# Patient Record
Sex: Female | Born: 1960 | Race: Black or African American | Hispanic: No | Marital: Married | Smoking: Current every day smoker
Health system: Southern US, Community
[De-identification: ages and names within clinical notes are randomized; demographics above are authoritative.]

## PROBLEM LIST (undated history)

## (undated) DIAGNOSIS — H0520 Unspecified exophthalmos: Secondary | ICD-10-CM

## (undated) DIAGNOSIS — J45909 Unspecified asthma, uncomplicated: Secondary | ICD-10-CM

## (undated) DIAGNOSIS — F32A Depression, unspecified: Secondary | ICD-10-CM

## (undated) DIAGNOSIS — F329 Major depressive disorder, single episode, unspecified: Secondary | ICD-10-CM

## (undated) DIAGNOSIS — R7989 Other specified abnormal findings of blood chemistry: Secondary | ICD-10-CM

## (undated) DIAGNOSIS — D473 Essential (hemorrhagic) thrombocythemia: Secondary | ICD-10-CM

## (undated) DIAGNOSIS — F419 Anxiety disorder, unspecified: Secondary | ICD-10-CM

## (undated) DIAGNOSIS — Z5189 Encounter for other specified aftercare: Secondary | ICD-10-CM

## (undated) DIAGNOSIS — E119 Type 2 diabetes mellitus without complications: Secondary | ICD-10-CM

## (undated) DIAGNOSIS — M1990 Unspecified osteoarthritis, unspecified site: Secondary | ICD-10-CM

## (undated) DIAGNOSIS — K219 Gastro-esophageal reflux disease without esophagitis: Secondary | ICD-10-CM

## (undated) DIAGNOSIS — E785 Hyperlipidemia, unspecified: Secondary | ICD-10-CM

## (undated) HISTORY — DX: Encounter for other specified aftercare: Z51.89

## (undated) HISTORY — DX: Hyperlipidemia, unspecified: E78.5

## (undated) HISTORY — DX: Gastro-esophageal reflux disease without esophagitis: K21.9

## (undated) HISTORY — DX: Unspecified exophthalmos: H05.20

## (undated) HISTORY — DX: Depression, unspecified: F32.A

## (undated) HISTORY — DX: Anxiety disorder, unspecified: F41.9

## (undated) HISTORY — DX: Type 2 diabetes mellitus without complications: E11.9

## (undated) HISTORY — DX: Other specified abnormal findings of blood chemistry: R79.89

## (undated) HISTORY — PX: BREAST BIOPSY: SHX20

## (undated) HISTORY — DX: Major depressive disorder, single episode, unspecified: F32.9

## (undated) HISTORY — DX: Unspecified osteoarthritis, unspecified site: M19.90

---
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
## 1995-09-10 HISTORY — PX: CHOLECYSTECTOMY: SHX55

## 2012-12-15 DIAGNOSIS — R109 Unspecified abdominal pain: Secondary | ICD-10-CM | POA: Insufficient documentation

## 2013-09-05 ENCOUNTER — Emergency Department: Payer: Self-pay | Admitting: Emergency Medicine

## 2013-09-05 LAB — WET PREP, GENITAL

## 2013-09-05 LAB — URINALYSIS, COMPLETE
Bilirubin,UR: NEGATIVE
Glucose,UR: >=500 mg/dL (ref 0–75)
Ketone: NEGATIVE
Leukocyte Esterase: NEGATIVE
Nitrite: NEGATIVE
Ph: 6 (ref 4.5–8.0)
WBC UR: <1 /HPF (ref 0–5)

## 2013-09-05 LAB — COMPREHENSIVE METABOLIC PANEL
Alkaline Phosphatase: 163 U/L — ABNORMAL HIGH
BUN: 6 mg/dL — ABNORMAL LOW (ref 7–18)
Bilirubin,Total: 0.3 mg/dL (ref 0.2–1.0)
Calcium, Total: 9.2 mg/dL (ref 8.5–10.1)
Chloride: 98 mmol/L (ref 98–107)
EGFR (African American): >60
EGFR (Non-African Amer.): >60
Glucose: 656 mg/dL (ref 65–99)
Osmolality: 293 (ref 275–301)
Potassium: 3 mmol/L — ABNORMAL LOW (ref 3.5–5.1)
SGOT(AST): 21 U/L (ref 15–37)
SGPT (ALT): 21 U/L (ref 12–78)
Sodium: 132 mmol/L — ABNORMAL LOW (ref 136–145)
Total Protein: 7.5 g/dL (ref 6.4–8.2)

## 2013-09-05 LAB — CBC
HGB: 13.1 g/dL (ref 12.0–16.0)
MCH: 27.3 pg (ref 26.0–34.0)
MCV: 80 fL (ref 80–100)
Platelet: 391 10*3/uL (ref 150–440)
WBC: 7.9 10*3/uL (ref 3.6–11.0)

## 2013-09-05 LAB — LIPASE, BLOOD: Lipase: 93 U/L (ref 73–393)

## 2013-09-18 ENCOUNTER — Emergency Department: Payer: Self-pay | Admitting: Emergency Medicine

## 2013-09-18 LAB — COMPREHENSIVE METABOLIC PANEL
ALT: 29 U/L (ref 12–78)
ANION GAP: 6 — AB (ref 7–16)
Albumin: 3.8 g/dL (ref 3.4–5.0)
Alkaline Phosphatase: 109 U/L
BUN: 4 mg/dL — ABNORMAL LOW (ref 7–18)
Bilirubin,Total: 0.3 mg/dL (ref 0.2–1.0)
CO2: 29 mmol/L (ref 21–32)
CREATININE: 0.79 mg/dL (ref 0.60–1.30)
Calcium, Total: 9.7 mg/dL (ref 8.5–10.1)
Chloride: 96 mmol/L — ABNORMAL LOW (ref 98–107)
EGFR (African American): >60
EGFR (Non-African Amer.): >60
GLUCOSE: 356 mg/dL — AB (ref 65–99)
Osmolality: 274 (ref 275–301)
Potassium: 2.9 mmol/L — ABNORMAL LOW (ref 3.5–5.1)
SGOT(AST): 13 U/L — ABNORMAL LOW (ref 15–37)
Sodium: 131 mmol/L — ABNORMAL LOW (ref 136–145)
Total Protein: 7.6 g/dL (ref 6.4–8.2)

## 2013-09-18 LAB — TROPONIN I: Troponin-I: <0.02 ng/mL

## 2013-09-18 LAB — CBC
HCT: 38 % (ref 35.0–47.0)
HGB: 13.5 g/dL (ref 12.0–16.0)
MCH: 28.3 pg (ref 26.0–34.0)
MCHC: 35.4 g/dL (ref 32.0–36.0)
MCV: 80 fL (ref 80–100)
Platelet: 500 10*3/uL — ABNORMAL HIGH (ref 150–440)
RBC: 4.75 10*6/uL (ref 3.80–5.20)
RDW: 14.4 % (ref 11.5–14.5)
WBC: 10.8 10*3/uL (ref 3.6–11.0)

## 2013-09-18 LAB — URINALYSIS, COMPLETE
BACTERIA: NONE SEEN
Bilirubin,UR: NEGATIVE
Blood: NEGATIVE
Ketone: NEGATIVE
LEUKOCYTE ESTERASE: NEGATIVE
Nitrite: NEGATIVE
Ph: 6 (ref 4.5–8.0)
Protein: NEGATIVE
RBC,UR: 1 /HPF (ref 0–5)
SPECIFIC GRAVITY: 1.02 (ref 1.003–1.030)
Squamous Epithelial: 1

## 2013-09-24 ENCOUNTER — Emergency Department: Payer: Self-pay | Admitting: Emergency Medicine

## 2013-09-24 LAB — URINALYSIS, COMPLETE
Bilirubin,UR: NEGATIVE
Ketone: NEGATIVE
Nitrite: NEGATIVE
PH: 5 (ref 4.5–8.0)
Protein: NEGATIVE
RBC,UR: 1 /HPF (ref 0–5)
SPECIFIC GRAVITY: 1.012 (ref 1.003–1.030)
Squamous Epithelial: <1
WBC UR: 3 /HPF (ref 0–5)

## 2014-03-15 DIAGNOSIS — E669 Obesity, unspecified: Secondary | ICD-10-CM | POA: Insufficient documentation

## 2014-03-15 DIAGNOSIS — E119 Type 2 diabetes mellitus without complications: Secondary | ICD-10-CM | POA: Insufficient documentation

## 2014-03-15 DIAGNOSIS — F17200 Nicotine dependence, unspecified, uncomplicated: Secondary | ICD-10-CM | POA: Insufficient documentation

## 2014-05-07 ENCOUNTER — Emergency Department: Payer: Self-pay | Admitting: Emergency Medicine

## 2014-05-07 LAB — BASIC METABOLIC PANEL
Anion Gap: 8 (ref 7–16)
BUN: 16 mg/dL (ref 7–18)
CHLORIDE: 103 mmol/L (ref 98–107)
Calcium, Total: 8.6 mg/dL (ref 8.5–10.1)
Co2: 29 mmol/L (ref 21–32)
Creatinine: 0.96 mg/dL (ref 0.60–1.30)
EGFR (African American): >60
EGFR (Non-African Amer.): >60
GLUCOSE: 192 mg/dL — AB (ref 65–99)
Osmolality: 286 (ref 275–301)
Potassium: 3.1 mmol/L — ABNORMAL LOW (ref 3.5–5.1)
Sodium: 140 mmol/L (ref 136–145)

## 2014-05-07 LAB — HEPATIC FUNCTION PANEL A (ARMC)
ALBUMIN: 3.3 g/dL — AB (ref 3.4–5.0)
ALK PHOS: 75 U/L
ALT: 19 U/L
BILIRUBIN TOTAL: 0.3 mg/dL (ref 0.2–1.0)
Bilirubin, Direct: <0.1 mg/dL (ref 0.00–0.20)
SGOT(AST): 21 U/L (ref 15–37)
TOTAL PROTEIN: 7.1 g/dL (ref 6.4–8.2)

## 2014-05-07 LAB — CBC
HCT: 37.1 % (ref 35.0–47.0)
HGB: 12.4 g/dL (ref 12.0–16.0)
MCH: 27.9 pg (ref 26.0–34.0)
MCHC: 33.5 g/dL (ref 32.0–36.0)
MCV: 83 fL (ref 80–100)
Platelet: 448 10*3/uL — ABNORMAL HIGH (ref 150–440)
RBC: 4.45 10*6/uL (ref 3.80–5.20)
RDW: 13.3 % (ref 11.5–14.5)
WBC: 10.4 10*3/uL (ref 3.6–11.0)

## 2014-05-07 LAB — TROPONIN I: Troponin-I: <0.02 ng/mL

## 2014-05-07 LAB — LIPASE, BLOOD: LIPASE: 56 U/L — AB (ref 73–393)

## 2014-08-17 ENCOUNTER — Emergency Department: Payer: Self-pay | Admitting: Emergency Medicine

## 2015-02-20 ENCOUNTER — Encounter: Payer: Self-pay | Admitting: Family Medicine

## 2015-02-20 ENCOUNTER — Ambulatory Visit (INDEPENDENT_AMBULATORY_CARE_PROVIDER_SITE_OTHER): Payer: Medicaid Other | Admitting: Family Medicine

## 2015-02-20 ENCOUNTER — Encounter (INDEPENDENT_AMBULATORY_CARE_PROVIDER_SITE_OTHER): Payer: Self-pay

## 2015-02-20 VITALS — BP 128/70 | HR 95 | Temp 98.5°F | Resp 16 | Ht 67.0 in | Wt 226.9 lb

## 2015-02-20 DIAGNOSIS — F418 Other specified anxiety disorders: Secondary | ICD-10-CM | POA: Diagnosis not present

## 2015-02-20 DIAGNOSIS — G4700 Insomnia, unspecified: Secondary | ICD-10-CM | POA: Insufficient documentation

## 2015-02-20 DIAGNOSIS — Z1211 Encounter for screening for malignant neoplasm of colon: Secondary | ICD-10-CM | POA: Diagnosis not present

## 2015-02-20 DIAGNOSIS — E119 Type 2 diabetes mellitus without complications: Secondary | ICD-10-CM | POA: Insufficient documentation

## 2015-02-20 DIAGNOSIS — E668 Other obesity: Secondary | ICD-10-CM | POA: Insufficient documentation

## 2015-02-20 DIAGNOSIS — M2550 Pain in unspecified joint: Secondary | ICD-10-CM

## 2015-02-20 DIAGNOSIS — E559 Vitamin D deficiency, unspecified: Secondary | ICD-10-CM | POA: Diagnosis not present

## 2015-02-20 DIAGNOSIS — K219 Gastro-esophageal reflux disease without esophagitis: Secondary | ICD-10-CM | POA: Diagnosis not present

## 2015-02-20 DIAGNOSIS — E669 Obesity, unspecified: Secondary | ICD-10-CM

## 2015-02-20 DIAGNOSIS — F419 Anxiety disorder, unspecified: Secondary | ICD-10-CM

## 2015-02-20 DIAGNOSIS — Z23 Encounter for immunization: Secondary | ICD-10-CM

## 2015-02-20 DIAGNOSIS — F329 Major depressive disorder, single episode, unspecified: Secondary | ICD-10-CM | POA: Insufficient documentation

## 2015-02-20 DIAGNOSIS — E782 Mixed hyperlipidemia: Secondary | ICD-10-CM | POA: Insufficient documentation

## 2015-02-20 DIAGNOSIS — E1169 Type 2 diabetes mellitus with other specified complication: Secondary | ICD-10-CM | POA: Insufficient documentation

## 2015-02-20 DIAGNOSIS — E785 Hyperlipidemia, unspecified: Secondary | ICD-10-CM

## 2015-02-20 DIAGNOSIS — R110 Nausea: Secondary | ICD-10-CM | POA: Diagnosis not present

## 2015-02-20 MED ORDER — CITALOPRAM HYDROBROMIDE 20 MG PO TABS: 20.0000 mg | ORAL_TABLET | Freq: Every day | ORAL | Status: DC

## 2015-02-20 MED ORDER — NAPROXEN 500 MG PO TABS: 500.0000 mg | ORAL_TABLET | Freq: Two times a day (BID) | ORAL | Status: DC

## 2015-02-20 MED ORDER — TRAZODONE HCL 50 MG PO TABS: 50.0000 mg | ORAL_TABLET | Freq: Every evening | ORAL | Status: DC | PRN

## 2015-02-20 MED ORDER — PRAVASTATIN SODIUM 10 MG PO TABS: 10.0000 mg | ORAL_TABLET | Freq: Every day | ORAL | Status: DC

## 2015-02-20 MED ORDER — ACCU-CHEK FASTCLIX LANCETS MISC: 1.0000 | Freq: Three times a day (TID) | Status: DC

## 2015-02-20 MED ORDER — VITAMIN D (ERGOCALCIFEROL) 1.25 MG (50000 UNIT) PO CAPS: 50000.0000 [IU] | ORAL_CAPSULE | ORAL | Status: DC

## 2015-02-20 MED ORDER — CYCLOBENZAPRINE HCL 5 MG PO TABS: 5.0000 mg | ORAL_TABLET | Freq: Every day | ORAL | Status: DC

## 2015-02-20 MED ORDER — ONDANSETRON HCL 4 MG PO TABS: 4.0000 mg | ORAL_TABLET | Freq: Three times a day (TID) | ORAL | Status: DC | PRN

## 2015-02-20 MED ORDER — OMEPRAZOLE 20 MG PO TBEC: 1.0000 | DELAYED_RELEASE_TABLET | Freq: Every day | ORAL | Status: DC

## 2015-02-20 MED ORDER — INSULIN GLARGINE 100 UNIT/ML SOLOSTAR PEN: 30.0000 [IU] | PEN_INJECTOR | Freq: Every day | SUBCUTANEOUS | Status: DC

## 2015-02-20 MED ORDER — SITAGLIP PHOS-METFORMIN HCL ER 50-1000 MG PO TB24: 1.0000 | ORAL_TABLET | Freq: Every day | ORAL | Status: DC

## 2015-02-20 NOTE — Progress Notes (Signed)
Name: Faith Smith   MRN: [id number]    DOB: [birth year]   Date:02/20/2015       Progress Note  Subjective  Chief Complaint  Chief Complaint  Patient presents with  . Diabetes    patient is here for her 2 month follow-up & refill of her medications  . Hyperlipidemia  . Obesity  . Arthritis    HPI  Patient is here for routine follow up of Diabetes Type II.  Current diabetes medication regimen includes Lantus 30units at bedtime, Janumet XR 50-1000mg  one a day. Patient is taking medications as instructed. Overall the patient feels that their blood glucose is well controlled. Checking blood glucose 1-3 times a day consistently. Fasting glucose range 100-140 mg/dL. Meal time glucose range 160-225 mg/dL. Lowest glucose result 103 mg/dL.   Related symptoms? None Any hypoglycemic symptoms?  No  Last Diabetic Eye Exam? 03/18/2014 Checking feet daily? Yes  Hyperlipidemia: Patient presents with hyperlipidemia.  She was tested because she also has DMII.  Her last labs showed Total cholesterol of 228mg /dL, HDL 37mg /dL, LDL 143mg /dL,  Triglycerides 240mg /dL. negative. There is a family history of hyperlipidemia. There is not a family history of early ischemia heart disease.   In general needs refills of all medications. Anxiety well controled on Celexa 20mg . Insomnia reasonably controled on Trazodone 50mg .   Has never had C-scope but is willing to go now. Due to TDAP shot. Has never had Pneumococcal shot.      Past Medical History  Diagnosis Date  . Anxiety   . Arthritis   . Blood transfusion without reported diagnosis     previously  . Depression   . Diabetes mellitus without complication   . GERD (gastroesophageal reflux disease)   . Hyperlipidemia     Past Surgical History  Procedure Laterality Date  . Cholecystectomy    . Cesarean section      had 3    Family History  Problem Relation Age of Onset  . Cancer Mother   . Hypertension Mother   . Depression Brother      History   Social History  . Marital Status: Married    Spouse Name: N/A  . Number of Children: 7  . Years of Education: N/A   Occupational History  . Not on file.   Social History Main Topics  . Smoking status: Current Every Day Smoker    Types: Cigarettes  . Smokeless tobacco: Not on file     Comment: patient states she is working on it now  . Alcohol Use: No  . Drug Use: No  . Sexual Activity:    Partners: Male   Other Topics Concern  . Not on file   Social History Narrative  . No narrative on file     Current outpatient prescriptions:  .  citalopram (CELEXA) 20 MG tablet, Take by mouth., Disp: , Rfl:  .  cyclobenzaprine (FLEXERIL) 5 MG tablet, Take by mouth., Disp: , Rfl:  .  Insulin Glargine (LANTUS SOLOSTAR) 100 UNIT/ML Solostar Pen, Inject into the skin., Disp: , Rfl:  .  naproxen (NAPROSYN) 500 MG tablet, Take by mouth., Disp: , Rfl:  .  Omeprazole 20 MG TBEC, Take by mouth., Disp: , Rfl:  .  ondansetron (ZOFRAN) 4 MG tablet, Take by mouth., Disp: , Rfl:  .  pravastatin (PRAVACHOL) 10 MG tablet, Take by mouth., Disp: , Rfl:  .  SitaGLIPtin-MetFORMIN HCl (JANUMET XR) 50-1000 MG TB24, Take by mouth., Disp: , Rfl:  .  traZODone (DESYREL) 50 MG tablet, Take by mouth., Disp: , Rfl:  .  Vitamin D, Ergocalciferol, (DRISDOL) 50000 UNITS CAPS capsule, Take by mouth., Disp: , Rfl:   Allergies  Allergen Reactions  . Atorvastatin Swelling     ROS  CONSTITUTIONAL: No significant weight changes, fever, chills, weakness or fatigue.  HEENT:  - Eyes: No visual changes.  - Ears: No auditory changes. No pain.  - Nose: No sneezing, congestion, runny nose. - Throat: No sore throat. No changes in swallowing. SKIN: No rash or itching.  CARDIOVASCULAR: No chest pain, chest pressure or chest discomfort. No palpitations or edema.  RESPIRATORY: No shortness of breath, cough or sputum.  GASTROINTESTINAL: No anorexia, nausea, vomiting. No changes in bowel habits. No  abdominal pain or blood.  GENITOURINARY: No dysuria. No frequency. No discharge.  NEUROLOGICAL: No headache, dizziness, syncope, paralysis, ataxia, numbness or tingling in the extremities. No memory changes. No change in bowel or bladder control.  MUSCULOSKELETAL: No joint pain. No muscle pain. HEMATOLOGIC: No anemia, bleeding or bruising.  LYMPHATICS: No enlarged lymph nodes.  PSYCHIATRIC: No change in mood. No change in sleep pattern.  ENDOCRINOLOGIC: No reports of sweating, cold or heat intolerance. No polyuria or polydipsia.   Objective  Filed Vitals:   02/20/15 0904  BP: 128/70  Pulse: 95  Temp: 98.5 F (36.9 C)  TempSrc: Oral  Resp: 16  Height: 5\' 7"  (1.702 m)  Weight: 226 lb 14.4 oz (102.921 kg)  SpO2: 97%    Physical Exam  Constitutional: Patient appears well-developed and well-nourished. In no distress.  HEENT:  - Head: Normocephalic and atraumatic.  - Ears: Bilateral TMs gray, no erythema or effusion - Nose: Nasal mucosa moist - Mouth/Throat: Oropharynx is clear and moist. No tonsillar hypertrophy or erythema. No post nasal drainage.  - Eyes: Conjunctivae clear, EOM movements normal. PERRLA. No scleral icterus.  Neck: Normal range of motion. Neck supple. No JVD present. No thyromegaly present.  Cardiovascular: Normal rate, regular rhythm and normal heart sounds.  No murmur heard.  Pulmonary/Chest: Effort normal and breath sounds normal. No respiratory distress. Musculoskeletal: Normal range of motion bilateral UE and LE, no joint effusions. Peripheral vascular: Bilateral LE no edema. Neurological: CN II-XII grossly intact with no focal deficits. Alert and oriented to person, place, and time. Coordination, balance, strength, speech and gait are normal.  Skin: Skin is warm and dry. No rash noted. No erythema.  Psychiatric: Patient has a normal mood and affect. Behavior is normal in office today. Judgment and thought content normal in office today.   Assessment &  Plan  1. Diabetes mellitus type 2, controlled, without complications  - Insulin Glargine (LANTUS SOLOSTAR) 100 UNIT/ML Solostar Pen; Inject 30 Units into the skin at bedtime.  Dispense: 3 pen; Refill: 5 - CBC with Differential/Platelet - COMPLETE METABOLIC PANEL WITH GFR - TSH - SitaGLIPtin-MetFORMIN HCl (JANUMET XR) 50-1000 MG TB24; Take 1 tablet by mouth daily.  Dispense: 90 tablet; Refill: 2 - ACCU-CHEK FASTCLIX LANCETS MISC; 1 Device by Does not apply route 3 (three) times daily.  Dispense: 100 each; Refill: 5  2. HLD (hyperlipidemia)  - Lipid panel - pravastatin (PRAVACHOL) 10 MG tablet; Take 1 tablet (10 mg total) by mouth at bedtime.  Dispense: 90 tablet; Refill: 2  3. Anxiety and depression  - citalopram (CELEXA) 20 MG tablet; Take 1 tablet (20 mg total) by mouth daily.  Dispense: 90 tablet; Refill: 2  4. Avitaminosis D  - Vitamin D 1,25 dihydroxy - Vitamin D,  Ergocalciferol, (DRISDOL) 50000 UNITS CAPS capsule; Take 1 capsule (50,000 Units total) by mouth every 7 (seven) days.  Dispense: 12 capsule; Refill: 0  5. Arthralgia of multiple joints  - cyclobenzaprine (FLEXERIL) 5 MG tablet; Take 1 tablet (5 mg total) by mouth at bedtime.  Dispense: 90 tablet; Refill: 1 - naproxen (NAPROSYN) 500 MG tablet; Take 1 tablet (500 mg total) by mouth 2 (two) times daily with a meal.  Dispense: 60 tablet; Refill: 5  6. Nausea without vomiting  - ondansetron (ZOFRAN) 4 MG tablet; Take 1 tablet (4 mg total) by mouth every 8 (eight) hours as needed for nausea or vomiting.  Dispense: 30 tablet; Refill: 2  7. Insomnia  - traZODone (DESYREL) 50 MG tablet; Take 1 tablet (50 mg total) by mouth at bedtime as needed for sleep.  Dispense: 30 tablet; Refill: 5  8. Colon cancer screening  - Ambulatory referral to General Surgery  9. Need for diphtheria-tetanus-pertussis (Tdap) vaccine, adult/adolescent  - Tdap vaccine greater than or equal to 7yo IM  10. Gastroesophageal reflux disease  without esophagitis  - Omeprazole 20 MG TBEC; Take 1 tablet (20 mg total) by mouth daily.  Dispense: 90 each; Refill: 2

## 2015-02-20 NOTE — Addendum Note (Signed)
Addended by: Bobetta Lime on: 02/20/2015 04:15 PM   Modules accepted: Miquel Dunn

## 2015-02-21 ENCOUNTER — Other Ambulatory Visit: Payer: Self-pay | Admitting: Family Medicine

## 2015-02-21 DIAGNOSIS — E782 Mixed hyperlipidemia: Secondary | ICD-10-CM

## 2015-02-21 DIAGNOSIS — E785 Hyperlipidemia, unspecified: Secondary | ICD-10-CM

## 2015-02-21 LAB — CBC WITH DIFFERENTIAL/PLATELET
BASOS ABS: 0.1 10*3/uL (ref 0.0–0.2)
Basos: 1 %
EOS (ABSOLUTE): 0.3 10*3/uL (ref 0.0–0.4)
Eos: 3 %
HEMATOCRIT: 36.8 % (ref 34.0–46.6)
Hemoglobin: 12.4 g/dL (ref 11.1–15.9)
Immature Grans (Abs): 0.1 10*3/uL (ref 0.0–0.1)
Immature Granulocytes: 1 %
Lymphocytes Absolute: 2.8 10*3/uL (ref 0.7–3.1)
Lymphs: 30 %
MCH: 27.7 pg (ref 26.6–33.0)
MCHC: 33.7 g/dL (ref 31.5–35.7)
MCV: 82 fL (ref 79–97)
Monocytes Absolute: 0.4 10*3/uL (ref 0.1–0.9)
Monocytes: 4 %
NEUTROS ABS: 5.7 10*3/uL (ref 1.4–7.0)
Neutrophils: 61 %
Platelets: 404 10*3/uL — ABNORMAL HIGH (ref 150–379)
RBC: 4.48 x10E6/uL (ref 3.77–5.28)
RDW: 13.5 % (ref 12.3–15.4)
WBC: 9.4 10*3/uL (ref 3.4–10.8)

## 2015-02-21 LAB — CMP14+EGFR
ALK PHOS: 75 IU/L (ref 39–117)
ALT: 20 IU/L (ref 0–32)
AST: 16 IU/L (ref 0–40)
Albumin/Globulin Ratio: 1.6 (ref 1.1–2.5)
Albumin: 4.2 g/dL (ref 3.5–5.5)
BUN / CREAT RATIO: 12 (ref 9–23)
BUN: 8 mg/dL (ref 6–24)
CHLORIDE: 99 mmol/L (ref 97–108)
CO2: 22 mmol/L (ref 18–29)
Calcium: 9.9 mg/dL (ref 8.7–10.2)
Creatinine, Ser: 0.69 mg/dL (ref 0.57–1.00)
GFR calc non Af Amer: 98 mL/min/{1.73_m2} (ref >59)
GFR, EST AFRICAN AMERICAN: 113 mL/min/{1.73_m2} (ref >59)
Globulin, Total: 2.6 g/dL (ref 1.5–4.5)
Glucose: 162 mg/dL — ABNORMAL HIGH (ref 65–99)
Potassium: 3.9 mmol/L (ref 3.5–5.2)
Sodium: 140 mmol/L (ref 134–144)
Total Protein: 6.8 g/dL (ref 6.0–8.5)

## 2015-02-21 LAB — TSH: TSH: 1.1 u[IU]/mL (ref 0.450–4.500)

## 2015-02-21 LAB — LIPID PANEL
Chol/HDL Ratio: 6.8 ratio units — ABNORMAL HIGH (ref 0.0–4.4)
Cholesterol, Total: 217 mg/dL — ABNORMAL HIGH (ref 100–199)
HDL: 32 mg/dL — AB (ref >39)
LDL CALC: 120 mg/dL — AB (ref 0–99)
Triglycerides: 325 mg/dL — ABNORMAL HIGH (ref 0–149)
VLDL CHOLESTEROL CAL: 65 mg/dL — AB (ref 5–40)

## 2015-02-21 MED ORDER — PRAVASTATIN SODIUM 20 MG PO TABS: 20.0000 mg | ORAL_TABLET | Freq: Every day | ORAL | Status: DC

## 2015-02-23 ENCOUNTER — Emergency Department
Admission: EM | Admit: 2015-02-23 | Discharge: 2015-02-23 | Disposition: A | Discharge status: Home / Self Care | Payer: Medicaid Other | Attending: Emergency Medicine | Admitting: Emergency Medicine

## 2015-02-23 ENCOUNTER — Encounter: Payer: Self-pay | Admitting: Emergency Medicine

## 2015-02-23 DIAGNOSIS — H578 Other specified disorders of eye and adnexa: Secondary | ICD-10-CM | POA: Diagnosis present

## 2015-02-23 DIAGNOSIS — E119 Type 2 diabetes mellitus without complications: Secondary | ICD-10-CM | POA: Insufficient documentation

## 2015-02-23 DIAGNOSIS — Z794 Long term (current) use of insulin: Secondary | ICD-10-CM | POA: Diagnosis not present

## 2015-02-23 DIAGNOSIS — Z720 Tobacco use: Secondary | ICD-10-CM | POA: Diagnosis not present

## 2015-02-23 DIAGNOSIS — Z79899 Other long term (current) drug therapy: Secondary | ICD-10-CM | POA: Insufficient documentation

## 2015-02-23 DIAGNOSIS — H109 Unspecified conjunctivitis: Secondary | ICD-10-CM | POA: Insufficient documentation

## 2015-02-23 MED ORDER — TOBRAMYCIN 0.3 % OP SOLN: 2.0000 [drp] | Freq: Four times a day (QID) | OPHTHALMIC | Status: AC

## 2015-02-23 NOTE — Discharge Instructions (Signed)

## 2015-02-23 NOTE — ED Provider Notes (Signed)
CSN: [id number]     Arrival date & time 02/23/15  1055 History   First MD Initiated Contact with Patient 02/23/15 1129     Chief Complaint  Patient presents with  . Conjunctivitis     HPI Comments: 54 year old female presents today complaining of left and right eye irritation for the past 3 days. Patient reports initially her left eye was irritated and now it has moved into her right eye. Her daughter works at an assisted living facility and has had conjunctivitis recently. She does not complain of any blurred vision, eye pain, sinus congestion or fevers. Not a contact lens wearer  Patient is a 54 y.o. female presenting with conjunctivitis. The history is provided by the patient.  Conjunctivitis This is a new problem. The current episode started in the past 7 days. The problem occurs constantly. The problem has been gradually worsening. Pertinent negatives include no chills, congestion, fever, nausea, rash, sore throat, visual change or vomiting. Nothing aggravates the symptoms. She has tried nothing for the symptoms. The treatment provided no relief.    Past Medical History  Diagnosis Date  . Anxiety   . Arthritis   . Blood transfusion without reported diagnosis     previously  . Depression   . Diabetes mellitus without complication   . GERD (gastroesophageal reflux disease)   . Hyperlipidemia    Past Surgical History  Procedure Laterality Date  . Cholecystectomy    . Cesarean section      had 3   Family History  Problem Relation Age of Onset  . Cancer Mother   . Hypertension Mother   . Depression Brother    History  Substance Use Topics  . Smoking status: Current Every Day Smoker    Types: Cigarettes  . Smokeless tobacco: Not on file     Comment: patient states she is working on it now  . Alcohol Use: No   OB History    Gravida Para Term Preterm AB TAB SAB Ectopic Multiple Living   7 7 6 1      7      Review of Systems  Constitutional: Negative for fever and  chills.  HENT: Negative for congestion and sore throat.   Eyes: Positive for discharge and redness. Negative for photophobia, pain, itching and visual disturbance.  Gastrointestinal: Negative for nausea and vomiting.  Skin: Negative for rash.  All other systems reviewed and are negative.     Allergies  Atorvastatin  Home Medications   Prior to Admission medications   Medication Sig Start Date End Date Taking? Authorizing Provider  ACCU-CHEK FASTCLIX LANCETS MISC 1 Device by Does not apply route 3 (three) times daily. 02/20/15   Bobetta Lime, MD  citalopram (CELEXA) 20 MG tablet Take 1 tablet (20 mg total) by mouth daily. 02/20/15   Bobetta Lime, MD  cyclobenzaprine (FLEXERIL) 5 MG tablet Take 1 tablet (5 mg total) by mouth at bedtime. 02/20/15   Bobetta Lime, MD  Insulin Glargine (LANTUS SOLOSTAR) 100 UNIT/ML Solostar Pen Inject 30 Units into the skin at bedtime. 02/20/15   Bobetta Lime, MD  naproxen (NAPROSYN) 500 MG tablet Take 1 tablet (500 mg total) by mouth 2 (two) times daily with a meal. 02/20/15   Bobetta Lime, MD  Omeprazole 20 MG TBEC Take 1 tablet (20 mg total) by mouth daily. 02/20/15   Bobetta Lime, MD  ondansetron (ZOFRAN) 4 MG tablet Take 1 tablet (4 mg total) by mouth every 8 (eight) hours as needed for nausea  or vomiting. 02/20/15   Bobetta Lime, MD  pravastatin (PRAVACHOL) 20 MG tablet Take 1 tablet (20 mg total) by mouth at bedtime. 02/21/15   Bobetta Lime, MD  SitaGLIPtin-MetFORMIN HCl (JANUMET XR) 50-1000 MG TB24 Take 1 tablet by mouth daily. 02/20/15   Bobetta Lime, MD  tobramycin (TOBREX) 0.3 % ophthalmic solution Place 2 drops into both eyes every 6 (six) hours. 02/23/15 03/05/15  Corliss Parish, PA-C  traZODone (DESYREL) 50 MG tablet Take 1 tablet (50 mg total) by mouth at bedtime as needed for sleep. 02/20/15   Bobetta Lime, MD  Vitamin D, Ergocalciferol, (DRISDOL) 50000 UNITS CAPS capsule Take 1 capsule (50,000 Units total) by mouth every 7  (seven) days. 02/20/15   Bobetta Lime, MD   BP 136/60 mmHg  Pulse 84  Temp(Src) 98.6 F (37 C) (Oral)  Resp 18  Ht 5\' 7"  (1.702 m)  Wt 220 lb (99.791 kg)  BMI 34.45 kg/m2  SpO2 99% Physical Exam  Constitutional: She is oriented to person, place, and time. Vital signs are normal. She appears well-developed and well-nourished.  Non-toxic appearance. She does not have a sickly appearance. She does not appear ill.  HENT:  Head: Normocephalic and atraumatic.  Right Ear: Tympanic membrane and external ear normal.  Left Ear: Tympanic membrane and external ear normal.  Nose: Nose normal.  Mouth/Throat: Uvula is midline, oropharynx is clear and moist and mucous membranes are normal.  Eyes: EOM and lids are normal. Pupils are equal, round, and reactive to light. Lids are everted and swept, no foreign bodies found. Right conjunctiva is injected. Left conjunctiva is injected.  Neck: Normal range of motion. Neck supple.  Musculoskeletal: Normal range of motion.  Lymphadenopathy:    She has no cervical adenopathy.  Neurological: She is alert and oriented to person, place, and time.  Skin: Skin is warm and dry.  Psychiatric: She has a normal mood and affect. Her behavior is normal. Judgment and thought content normal.  Nursing note and vitals reviewed.   ED Course  Procedures (including critical care time) Labs Review Labs Reviewed - No data to display  Imaging Review No results found.   EKG Interpretation None      MDM  Exam consistent with bilateral conjunctivitis. Tobrex eyedrops for 10 days. Advised follow with PCP next week for recheck. Return immediately for increased swelling, eye pain or visual abnormalities. Final diagnoses:  Bilateral conjunctivitis        Corliss Parish, PA-C 02/23/15 1147  Lavonia Drafts, MD 02/23/15 [phone]

## 2015-02-23 NOTE — ED Notes (Signed)
Pt verbalized understanding of instructions and medications.

## 2015-02-23 NOTE — ED Notes (Signed)
Pt reports redness and irritation to right eye several days ago and now in both eyes. Pt reports in the AM she has drainage.

## 2015-02-23 NOTE — ED Notes (Signed)
Reports redness and drainage from eyes x 3 days

## 2015-02-24 LAB — VITAMIN D 1,25 DIHYDROXY
VITAMIN D2 1, 25 (OH): 38 pg/mL
VITAMIN D3 1, 25 (OH): 22 pg/mL
Vitamin D 1, 25 (OH)2 Total: 60 pg/mL

## 2015-03-03 ENCOUNTER — Other Ambulatory Visit: Payer: Self-pay

## 2015-03-03 ENCOUNTER — Telehealth: Payer: Self-pay

## 2015-03-03 NOTE — Telephone Encounter (Signed)
Gastroenterology Pre-Procedure Review  Request Date: 05-02-15 Requesting Physician: Dr. Nadine Counts  PATIENT REVIEW QUESTIONS: The patient responded to the following health history questions as indicated:    1. Are you having any GI issues? no 2. Do you have a personal history of Polyps? no 3. Do you have a family history of Colon Cancer or Polyps? no 4. Diabetes Mellitus? yes (type 2) 5. Joint replacements in the past 12 months?no 6. Major health problems in the past 3 months?no 7. Any artificial heart valves, MVP, or defibrillator?no    MEDICATIONS & ALLERGIES:    Patient reports the following regarding taking any anticoagulation/antiplatelet therapy:   Plavix, Coumadin, Eliquis, Xarelto, Lovenox, Pradaxa, Brilinta, or Effient? no Aspirin? no  Patient confirms/reports the following medications:  Current Outpatient Prescriptions  Medication Sig Dispense Refill  . ACCU-CHEK FASTCLIX LANCETS MISC 1 Device by Does not apply route 3 (three) times daily. 100 each 5  . citalopram (CELEXA) 20 MG tablet Take 1 tablet (20 mg total) by mouth daily. 90 tablet 2  . cyclobenzaprine (FLEXERIL) 5 MG tablet Take 1 tablet (5 mg total) by mouth at bedtime. 90 tablet 1  . Insulin Glargine (LANTUS SOLOSTAR) 100 UNIT/ML Solostar Pen Inject 30 Units into the skin at bedtime. 3 pen 5  . naproxen (NAPROSYN) 500 MG tablet Take 1 tablet (500 mg total) by mouth 2 (two) times daily with a meal. 60 tablet 5  . Omeprazole 20 MG TBEC Take 1 tablet (20 mg total) by mouth daily. 90 each 2  . ondansetron (ZOFRAN) 4 MG tablet Take 1 tablet (4 mg total) by mouth every 8 (eight) hours as needed for nausea or vomiting. 30 tablet 2  . pravastatin (PRAVACHOL) 20 MG tablet Take 1 tablet (20 mg total) by mouth at bedtime. 90 tablet 1  . SitaGLIPtin-MetFORMIN HCl (JANUMET XR) 50-1000 MG TB24 Take 1 tablet by mouth daily. 90 tablet 2  . tobramycin (TOBREX) 0.3 % ophthalmic solution Place 2 drops into both eyes every 6 (six) hours.  5 mL 0  . traZODone (DESYREL) 50 MG tablet Take 1 tablet (50 mg total) by mouth at bedtime as needed for sleep. 30 tablet 5  . Vitamin D, Ergocalciferol, (DRISDOL) 50000 UNITS CAPS capsule Take 1 capsule (50,000 Units total) by mouth every 7 (seven) days. 12 capsule 0   No current facility-administered medications for this visit.    Patient confirms/reports the following allergies:  Allergies  Allergen Reactions  . Atorvastatin Other (See Comments)    Arthralgia    No orders of the defined types were placed in this encounter.    AUTHORIZATION INFORMATION Primary Insurance: 1D#: Group #:  Secondary Insurance: 1D#: Group #:  SCHEDULE INFORMATION: Date: 05-02-15 Time: Location: [place]

## 2015-03-28 ENCOUNTER — Encounter: Payer: Medicaid Other | Admitting: Family Medicine

## 2015-04-14 ENCOUNTER — Encounter (INDEPENDENT_AMBULATORY_CARE_PROVIDER_SITE_OTHER): Payer: Self-pay

## 2015-04-14 ENCOUNTER — Encounter: Payer: Self-pay | Admitting: Family Medicine

## 2015-04-14 ENCOUNTER — Ambulatory Visit (INDEPENDENT_AMBULATORY_CARE_PROVIDER_SITE_OTHER): Payer: Medicaid Other | Admitting: Family Medicine

## 2015-04-14 VITALS — BP 118/76 | HR 98 | Temp 98.5°F | Resp 18 | Wt 223.1 lb

## 2015-04-14 DIAGNOSIS — Z1239 Encounter for other screening for malignant neoplasm of breast: Secondary | ICD-10-CM

## 2015-04-14 DIAGNOSIS — N6320 Unspecified lump in the left breast, unspecified quadrant: Secondary | ICD-10-CM | POA: Insufficient documentation

## 2015-04-14 DIAGNOSIS — M25562 Pain in left knee: Secondary | ICD-10-CM

## 2015-04-14 DIAGNOSIS — N63 Unspecified lump in breast: Secondary | ICD-10-CM

## 2015-04-14 DIAGNOSIS — M8949 Other hypertrophic osteoarthropathy, multiple sites: Secondary | ICD-10-CM | POA: Insufficient documentation

## 2015-04-14 DIAGNOSIS — M158 Other polyosteoarthritis: Secondary | ICD-10-CM

## 2015-04-14 DIAGNOSIS — Z0000 Encounter for general adult medical examination without abnormal findings: Secondary | ICD-10-CM

## 2015-04-14 DIAGNOSIS — M25532 Pain in left wrist: Secondary | ICD-10-CM

## 2015-04-14 MED ORDER — MELOXICAM 15 MG PO TABS: 15.0000 mg | ORAL_TABLET | Freq: Every day | ORAL | Status: DC

## 2015-04-14 MED ORDER — PREDNISONE 10 MG (21) PO TBPK: ORAL_TABLET | ORAL | Status: DC

## 2015-04-14 NOTE — Patient Instructions (Signed)
FOLLOW UP WITH [facility] FOR DIABETIC EYE EXAM

## 2015-04-14 NOTE — Progress Notes (Signed)
Name: Faith Smith   MRN: [id number]    DOB: 01/30/1961   Date:04/14/2015       Progress Note  Subjective  Chief Complaint  Chief Complaint  Patient presents with  . Annual Exam  . Joint Pain    patient states she needs something for left wrist and left knee pain.     HPI  Patient is here today for a Complete Female Physical Exam:  The patient has has no unusual complaints and complains of arthritis. Overall feels healthy. Diet is better balanced. Chronic medical issues such as diabetes type 2 well controled, Hba1c 12/20/14 was 6.5%. In general does not exercise regularly. Sees dentist regularly and addresses vision concerns with ophthalmologist if applicable. In regards to sexual activity the patient is not currently sexually active. Currently is not concerned about exposure to any STDs.   Menstrual history is postmenopausal.   Joint/Muscle Pain: Patient complains of arthralgias for which has been present for 3 months. Pain is located in left shoulder, left wrist and left knee, is described as aching and throbbing, and is constant .  Associated symptoms include: decreased range of motion especially with overhead lifting of left arm. The patient has using her usual Naproxen $RemoveBefore'500mg'IrmDbCgtkZNbG$  2-3x/day with minimal relief.  Past Medical History  Diagnosis Date  . Anxiety   . Arthritis   . Blood transfusion without reported diagnosis     previously  . Depression   . Diabetes mellitus without complication   . GERD (gastroesophageal reflux disease)   . Hyperlipidemia     Past Surgical History  Procedure Laterality Date  . Cholecystectomy    . Cesarean section      had 3    Family History  Problem Relation Age of Onset  . Cancer Mother   . Hypertension Mother   . Depression Brother     History   Social History  . Marital Status: Married    Spouse Name: N/A  . Number of Children: 7  . Years of Education: N/A   Occupational History  . Not on file.   Social History Main Topics   . Smoking status: Current Every Day Smoker    Types: Cigarettes  . Smokeless tobacco: Not on file     Comment: patient states she is working on it now  . Alcohol Use: No  . Drug Use: No  . Sexual Activity:    Partners: Male   Other Topics Concern  . Not on file   Social History Narrative     Current outpatient prescriptions:  .  ACCU-CHEK FASTCLIX LANCETS MISC, 1 Device by Does not apply route 3 (three) times daily., Disp: 100 each, Rfl: 5 .  cholecalciferol (VITAMIN D) 1000 UNITS tablet, Take 1,000 Units by mouth daily., Disp: , Rfl:  .  citalopram (CELEXA) 20 MG tablet, Take 1 tablet (20 mg total) by mouth daily., Disp: 90 tablet, Rfl: 2 .  cyclobenzaprine (FLEXERIL) 5 MG tablet, Take 1 tablet (5 mg total) by mouth at bedtime., Disp: 90 tablet, Rfl: 1 .  Insulin Glargine (LANTUS SOLOSTAR) 100 UNIT/ML Solostar Pen, Inject 30 Units into the skin at bedtime., Disp: 3 pen, Rfl: 5 .  metFORMIN (GLUCOPHAGE) 1000 MG tablet, Take by mouth., Disp: , Rfl:  .  naproxen (NAPROSYN) 500 MG tablet, Take 1 tablet (500 mg total) by mouth 2 (two) times daily with a meal., Disp: 60 tablet, Rfl: 5 .  Omeprazole 20 MG TBEC, Take 1 tablet (20 mg total) by mouth  daily., Disp: 90 each, Rfl: 2 .  ondansetron (ZOFRAN) 4 MG tablet, Take 1 tablet (4 mg total) by mouth every 8 (eight) hours as needed for nausea or vomiting., Disp: 30 tablet, Rfl: 2 .  pravastatin (PRAVACHOL) 20 MG tablet, Take 1 tablet (20 mg total) by mouth at bedtime., Disp: 90 tablet, Rfl: 1 .  SitaGLIPtin-MetFORMIN HCl (JANUMET XR) 50-1000 MG TB24, Take 1 tablet by mouth daily., Disp: 90 tablet, Rfl: 2 .  traZODone (DESYREL) 50 MG tablet, Take 1 tablet (50 mg total) by mouth at bedtime as needed for sleep., Disp: 30 tablet, Rfl: 5 .  Vitamin D, Ergocalciferol, (DRISDOL) 50000 UNITS CAPS capsule, Take 1 capsule (50,000 Units total) by mouth every 7 (seven) days., Disp: 12 capsule, Rfl: 0  Allergies  Allergen Reactions  . Atorvastatin  Other (See Comments)    Arthralgia    ROS  CONSTITUTIONAL: No significant weight changes, fever, chills, weakness or fatigue.  HEENT:  - Eyes: No visual changes.  - Ears: No auditory changes. No pain.  - Nose: No sneezing, congestion, runny nose. - Throat: No sore throat. No changes in swallowing. SKIN: No rash or itching.  CARDIOVASCULAR: No chest pain, chest pressure or chest discomfort. No palpitations or edema.  RESPIRATORY: No shortness of breath, cough or sputum.  GASTROINTESTINAL: No anorexia, nausea, vomiting. No changes in bowel habits. No abdominal pain or blood.  GENITOURINARY: No dysuria. No frequency. No discharge.  NEUROLOGICAL: No headache, dizziness, syncope, paralysis, ataxia, numbness or tingling in the extremities. No memory changes. No change in bowel or bladder control.  MUSCULOSKELETAL: Yes joint pain. No muscle pain. HEMATOLOGIC: No anemia, bleeding or bruising.  LYMPHATICS: No enlarged lymph nodes.  PSYCHIATRIC: No change in mood. No change in sleep pattern.  ENDOCRINOLOGIC: No reports of sweating, cold or heat intolerance. No polyuria or polydipsia.   Objective  Filed Vitals:   04/14/15 0822  BP: 118/76  Pulse: 98  Temp: 98.5 F (36.9 C)  TempSrc: Oral  Resp: 18  Weight: 223 lb 1.6 oz (101.197 kg)  SpO2: 98%   Body mass index is 34.93 kg/(m^2).  Depression screening not applicable patient is already diagnosed and managed for this disorder.  Recent Results (from the past 2160 hour(s))  CBC with Differential/Platelet     Status: Abnormal   Collection Time: 02/20/15 10:15 AM  Result Value Ref Range   WBC 9.4 3.4 - 10.8 x10E3/uL   RBC 4.48 3.77 - 5.28 x10E6/uL   Hemoglobin 12.4 11.1 - 15.9 g/dL   Hematocrit 94.7 98.4 - 46.6 %   MCV 82 79 - 97 fL   MCH 27.7 26.6 - 33.0 pg   MCHC 33.7 31.5 - 35.7 g/dL   RDW 82.1 47.7 - 26.7 %   Platelets 404 (H) 150 - 379 x10E3/uL   Neutrophils 61 %   Lymphs 30 %   Monocytes 4 %   Eos 3 %   Basos 1 %    Neutrophils Absolute 5.7 1.4 - 7.0 x10E3/uL   Lymphocytes Absolute 2.8 0.7 - 3.1 x10E3/uL   Monocytes Absolute 0.4 0.1 - 0.9 x10E3/uL   EOS (ABSOLUTE) 0.3 0.0 - 0.4 x10E3/uL   Basophils Absolute 0.1 0.0 - 0.2 x10E3/uL   Immature Granulocytes 1 %   Immature Grans (Abs) 0.1 0.0 - 0.1 x10E3/uL  Lipid panel     Status: Abnormal   Collection Time: 02/20/15 10:15 AM  Result Value Ref Range   Cholesterol, Total 217 (H) 100 - 199 mg/dL  Triglycerides 325 (H) 0 - 149 mg/dL   HDL 32 (L) >39 mg/dL    Comment: According to ATP-III Guidelines, HDL-C >59 mg/dL is considered a negative risk factor for CHD.    VLDL Cholesterol Cal 65 (H) 5 - 40 mg/dL   LDL Calculated 120 (H) 0 - 99 mg/dL   Chol/HDL Ratio 6.8 (H) 0.0 - 4.4 ratio units    Comment:                                   T. Chol/HDL Ratio                                             Men  Women                               1/2 Avg.Risk  3.4    3.3                                   Avg.Risk  5.0    4.4                                2X Avg.Risk  9.6    7.1                                3X Avg.Risk 23.4   11.0   TSH     Status: None   Collection Time: 02/20/15 10:15 AM  Result Value Ref Range   TSH 1.100 0.450 - 4.500 uIU/mL  Vitamin D 1,25 dihydroxy     Status: None   Collection Time: 02/20/15 10:15 AM  Result Value Ref Range   Vitamin D 1, 25 (OH)2 Total 60 pg/mL    Comment: Reference Range: Adults: 21 - 65    Vitamin D2 1, 25 (OH)2 38 pg/mL   Vitamin D3 1, 25 (OH)2 22 pg/mL  CMP14+EGFR     Status: Abnormal   Collection Time: 02/20/15 10:15 AM  Result Value Ref Range   Glucose 162 (H) 65 - 99 mg/dL    Comment: Specimen received in contact with cells. No visible hemolysis present. However GLUC may be decreased and K increased. Clinical correlation indicated.    BUN 8 6 - 24 mg/dL   Creatinine, Ser 0.69 0.57 - 1.00 mg/dL   GFR calc non Af Amer 98 >59 mL/min/1.73   GFR calc Af Amer 113 >59 mL/min/1.73   BUN/Creatinine Ratio  12 9 - 23   Sodium 140 134 - 144 mmol/L   Potassium 3.9 3.5 - 5.2 mmol/L   Chloride 99 97 - 108 mmol/L   CO2 22 18 - 29 mmol/L   Calcium 9.9 8.7 - 10.2 mg/dL   Total Protein 6.8 6.0 - 8.5 g/dL   Albumin 4.2 3.5 - 5.5 g/dL   Globulin, Total 2.6 1.5 - 4.5 g/dL   Albumin/Globulin Ratio 1.6 1.1 - 2.5   Bilirubin Total <0.2 0.0 - 1.2 mg/dL   Alkaline Phosphatase 75 39 - 117 IU/L   AST 16 0 - 40 IU/L  ALT 20 0 - 32 IU/L    Physical Exam  Constitutional: Patient is obese and well-nourished. In no distress.  HEENT:  - Head: Normocephalic and atraumatic.  - Ears: Bilateral TMs gray, no erythema or effusion - Nose: Nasal mucosa moist - Mouth/Throat: Oropharynx is clear and moist. No tonsillar hypertrophy or erythema. No post nasal drainage.  - Eyes: Conjunctivae clear, EOM movements normal. PERRLA. No scleral icterus.  Neck: Normal range of motion. Neck supple. No JVD present. No thyromegaly present.  Cardiovascular: Normal rate, regular rhythm and normal heart sounds.  No murmur heard.  Pulmonary/Chest: Effort normal and breath sounds normal. No respiratory distress. Abdominal: Soft. Bowel sounds are normal, no distension. There is no tenderness. no masses BREAST: Right breast and axilla exam normal with no masses or nipple changes. Left breast exam reveals soft mildly tender masses at the 11-12o'clock positions of breast. No axillary masses or nipple changes. FEMALE GENITALIA: Deffered RECTAL: Deffered Musculoskeletal: Normal range of motion bilateral UE and LE however left UE painful active ROM above 90 degrees and left wrist with some warmth and mild swelling. Crepitus in bilateral knees. Peripheral vascular: Bilateral LE no edema. Neurological: CN II-XII grossly intact with no focal deficits. Alert and oriented to person, place, and time. Coordination, balance, strength, speech and gait are normal.  Skin: Skin is warm and dry. No rash noted. No erythema.  Psychiatric: Patient has a  normal mood and affect. Behavior is normal in office today. Judgment and thought content normal in office today.   Assessment & Plan  1. Annual physical exam Discussed preventative measures. Last Pap 2014. Needs to follow up with [facility] for her yearly diabetic eye exam. Has not had colonoscopy yet per review of records.   2. Breast cancer screening  - MM Digital Screening; Future  3. Osteoarthrosis multiple sites, not specified as generalized May need X-rays if ongoing issues with left shoulder and left wrist. Consider rheumatological diagnosis and get lab work near future. Stop naproxen, trial of prednisone taper pack and meloxicam.  - meloxicam (MOBIC) 15 MG tablet; Take 1 tablet (15 mg total) by mouth daily.  Dispense: 30 tablet; Refill: 2 - predniSONE (STERAPRED UNI-PAK 21 TAB) 10 MG (21) TBPK tablet; Use as directed in a 6 day taper predpak  Dispense: 21 tablet; Refill: 0  4. Breast mass, left Right breast and axilla exam normal with no masses or nipple changes. Left breast exam reveals soft mildly tender masses at the 11-12o'clock positions of breast. No axillary masses or nipple changes.  - MM Digital Diagnostic Unilat L; Future

## 2015-04-27 ENCOUNTER — Encounter: Payer: Self-pay | Admitting: Emergency Medicine

## 2015-04-27 ENCOUNTER — Other Ambulatory Visit: Payer: Self-pay

## 2015-04-27 ENCOUNTER — Emergency Department
Admission: EM | Admit: 2015-04-27 | Discharge: 2015-04-27 | Disposition: A | Discharge status: Home / Self Care | Payer: Medicaid Other | Attending: Emergency Medicine | Admitting: Emergency Medicine

## 2015-04-27 DIAGNOSIS — R110 Nausea: Secondary | ICD-10-CM

## 2015-04-27 DIAGNOSIS — Z720 Tobacco use: Secondary | ICD-10-CM | POA: Diagnosis not present

## 2015-04-27 DIAGNOSIS — E119 Type 2 diabetes mellitus without complications: Secondary | ICD-10-CM | POA: Insufficient documentation

## 2015-04-27 DIAGNOSIS — R112 Nausea with vomiting, unspecified: Secondary | ICD-10-CM | POA: Diagnosis not present

## 2015-04-27 DIAGNOSIS — R531 Weakness: Secondary | ICD-10-CM | POA: Diagnosis not present

## 2015-04-27 LAB — URINALYSIS COMPLETE WITH MICROSCOPIC (ARMC ONLY)
Bacteria, UA: NONE SEEN
Bilirubin Urine: NEGATIVE
GLUCOSE, UA: NEGATIVE mg/dL
Hgb urine dipstick: NEGATIVE
KETONES UR: NEGATIVE mg/dL
Leukocytes, UA: NEGATIVE
NITRITE: NEGATIVE
Protein, ur: NEGATIVE mg/dL
RBC / HPF: NONE SEEN RBC/hpf (ref 0–5)
SPECIFIC GRAVITY, URINE: 1.012 (ref 1.005–1.030)
pH: 6 (ref 5.0–8.0)

## 2015-04-27 LAB — BASIC METABOLIC PANEL
ANION GAP: 9 (ref 5–15)
BUN: 11 mg/dL (ref 6–20)
CO2: 27 mmol/L (ref 22–32)
Calcium: 9.6 mg/dL (ref 8.9–10.3)
Chloride: 103 mmol/L (ref 101–111)
Creatinine, Ser: 0.76 mg/dL (ref 0.44–1.00)
GFR calc non Af Amer: >60 mL/min (ref >60)
Glucose, Bld: 155 mg/dL — ABNORMAL HIGH (ref 65–99)
POTASSIUM: 3.2 mmol/L — AB (ref 3.5–5.1)
SODIUM: 139 mmol/L (ref 135–145)

## 2015-04-27 LAB — CBC
HCT: 36.1 % (ref 35.0–47.0)
HEMOGLOBIN: 12.2 g/dL (ref 12.0–16.0)
MCH: 27.6 pg (ref 26.0–34.0)
MCHC: 33.8 g/dL (ref 32.0–36.0)
MCV: 81.8 fL (ref 80.0–100.0)
Platelets: 450 10*3/uL — ABNORMAL HIGH (ref 150–440)
RBC: 4.41 MIL/uL (ref 3.80–5.20)
RDW: 14.2 % (ref 11.5–14.5)
WBC: 9.3 10*3/uL (ref 3.6–11.0)

## 2015-04-27 LAB — GLUCOSE, CAPILLARY: Glucose-Capillary: 147 mg/dL — ABNORMAL HIGH (ref 65–99)

## 2015-04-27 MED ORDER — ONDANSETRON 4 MG PO TBDP
4.0000 mg | ORAL_TABLET | Freq: Once | ORAL | Status: AC
  Administered 2015-04-27: 4 mg via ORAL
  Filled 2015-04-27: qty 1

## 2015-04-27 MED ORDER — ONDANSETRON 4 MG PO TBDP: 4.0000 mg | ORAL_TABLET | Freq: Three times a day (TID) | ORAL | Status: DC | PRN

## 2015-04-27 NOTE — Discharge Instructions (Signed)
Nausea, Adult Nausea is the feeling that you have an upset stomach or have to vomit. Nausea by itself is not likely a serious concern, but it may be an early sign of more serious medical problems. As nausea gets worse, it can lead to vomiting. If vomiting develops, there is the risk of dehydration.  CAUSES   Viral infections.  Food poisoning.  Medicines.  Pregnancy.  Motion sickness.  Migraine headaches.  Emotional distress.  Severe pain from any source.  Alcohol intoxication. HOME CARE INSTRUCTIONS  Get plenty of rest.  Ask your caregiver about specific rehydration instructions.  Eat small amounts of food and sip liquids more often.  Take all medicines as told by your caregiver. SEEK MEDICAL CARE IF:  You have not improved after 2 days, or you get worse.  You have a headache. SEEK IMMEDIATE MEDICAL CARE IF:   You have a fever.  You faint.  You keep vomiting or have blood in your vomit.  You are extremely weak or dehydrated.  You have dark or bloody stools.  You have severe chest or abdominal pain. MAKE SURE YOU:  Understand these instructions.  Will watch your condition.  Will get help right away if you are not doing well or get worse. Document Released: 10/03/2004 Document Revised: 05/20/2012 Document Reviewed: 05/08/2011 [facility] Patient Information 2015 [place], [place]. This information is not intended to replace advice given to you by your health care provider. Make sure you discuss any questions you have with your health care provider.  Weakness Weakness is a lack of strength. It may be felt all over the body (generalized) or in one specific part of the body (focal). Some causes of weakness can be serious. You may need further medical evaluation, especially if you are elderly or you have a history of immunosuppression (such as chemotherapy or HIV), kidney disease, heart disease, or diabetes. CAUSES  Weakness can be caused by many different things,  including:  Infection.  Physical exhaustion.  Internal bleeding or other blood loss that results in a lack of red blood cells (anemia).  Dehydration. This cause is more common in elderly people.  Side effects or electrolyte abnormalities from medicines, such as pain medicines or sedatives.  Emotional distress, anxiety, or depression.  Circulation problems, especially severe peripheral arterial disease.  Heart disease, such as rapid atrial fibrillation, bradycardia, or heart failure.  Nervous system disorders, such as Guillain-Barr syndrome, multiple sclerosis, or stroke. DIAGNOSIS  To find the cause of your weakness, your caregiver will take your history and perform a physical exam. Lab tests or X-rays may also be ordered, if needed. TREATMENT  Treatment of weakness depends on the cause of your symptoms and can vary greatly. HOME CARE INSTRUCTIONS   Rest as needed.  Eat a well-balanced diet.  Try to get some exercise every day.  Only take over-the-counter or prescription medicines as directed by your caregiver. SEEK MEDICAL CARE IF:   Your weakness seems to be getting worse or spreads to other parts of your body.  You develop new aches or pains. SEEK IMMEDIATE MEDICAL CARE IF:   You cannot perform your normal daily activities, such as getting dressed and feeding yourself.  You cannot walk up and down stairs, or you feel exhausted when you do so.  You have shortness of breath or chest pain.  You have difficulty moving parts of your body.  You have weakness in only one area of the body or on only one side of the body.  You have a fever.  You have trouble speaking or swallowing.  You cannot control your bladder or bowel movements.  You have black or bloody vomit or stools. MAKE SURE YOU:  Understand these instructions.  Will watch your condition.  Will get help right away if you are not doing well or get worse. Document Released: 08/26/2005 Document  Revised: 02/25/2012 Document Reviewed: 10/25/2011 [facility] Patient Information 2015 [place], [place]. This information is not intended to replace advice given to you by your health care provider. Make sure you discuss any questions you have with your health care provider.

## 2015-04-27 NOTE — ED Notes (Signed)
Patient to ED with report of weakness, nausea and just generally not feeling well since Monday, reports blood sugars have been in the 160s.

## 2015-04-27 NOTE — ED Provider Notes (Signed)
[facility] Emergency Department [place] Note     Time seen: ----------------------------------------- 3:13 PM on 04/27/2015 -----------------------------------------    I have reviewed the triage vital signs and the nursing notes.   HISTORY  Chief Complaint Fatigue and Nausea    HPI Faith Smith is a 54 y.o. female who presents ER complaining of weakness nausea and generally not feeling well since Monday. Patient reports blood sugars have been in the 160s. She has had nausea but only vomiting on Monday. Not since that period time. Denies any fevers, chills, chest pain, shortness of breath, diarrhea. Reports no recent changes in her medicines. Past Medical History  Diagnosis Date  . Anxiety   . Arthritis   . Blood transfusion without reported diagnosis     previously  . Depression   . Diabetes mellitus without complication   . GERD (gastroesophageal reflux disease)   . Hyperlipidemia     Patient Active Problem List   Diagnosis Date Noted  . Osteoarthrosis multiple sites, not specified as generalized 04/14/2015  . Annual physical exam 04/14/2015  . Breast cancer screening 04/14/2015  . Breast mass, left 04/14/2015  . Anxiety and depression 02/20/2015  . Diabetes mellitus type 2, controlled, without complications 76/54/6503  . GERD (gastroesophageal reflux disease) 02/20/2015  . Hypercholesterolemia with hypertriglyceridemia 02/20/2015  . Arthralgia of multiple joints 02/20/2015  . Extreme obesity 02/20/2015  . Vitamin D deficiency 02/20/2015  . Nausea without vomiting 02/20/2015  . Insomnia 02/20/2015  . Diabetes 03/15/2014  . Adiposity 03/15/2014  . Current smoker 03/15/2014  . Abdominal pain 12/15/2012    Past Surgical History  Procedure Laterality Date  . Cholecystectomy    . Cesarean section      had 3    Allergies Atorvastatin  Social History Social History  Substance Use Topics  . Smoking status: Current Every Day  Smoker    Types: Cigarettes  . Smokeless tobacco: None     Comment: patient states she is working on it now  . Alcohol Use: No    Review of Systems Constitutional: Negative for fever. Eyes: Negative for visual changes. ENT: Negative for sore throat. Cardiovascular: Negative for chest pain. Respiratory: Negative for shortness of breath. Gastrointestinal: Negative for abdominal pain, positive for nausea and occasional vomiting Genitourinary: Negative for dysuria. Musculoskeletal: Negative for back pain. Skin: Negative for rash. Neurological: Negative for headaches, focal weakness or numbness.  10-point ROS otherwise negative.  ____________________________________________   PHYSICAL EXAM:  VITAL SIGNS: ED Triage Vitals  Enc Vitals Group     BP 04/27/15 1228 167/77 mmHg     Pulse Rate 04/27/15 1228 70     Resp 04/27/15 1228 20     Temp 04/27/15 1228 98.1 F (36.7 C)     Temp Source 04/27/15 1228 Oral     SpO2 04/27/15 1228 99 %     Weight 04/27/15 1228 187 lb (84.823 kg)     Height 04/27/15 1228 5\' 7"  (1.702 m)     Head Cir --      Peak Flow --      Pain Score --      Pain Loc --      Pain Edu? --      Excl. in [place]? --     Constitutional: Alert and oriented. Well appearing and in no distress. Eyes: Conjunctivae are normal. PERRL. Normal extraocular movements. ENT   Head: Normocephalic and atraumatic.   Nose: No congestion/rhinnorhea.   Mouth/Throat: Mucous membranes are moist.  Neck: No stridor. Cardiovascular: Normal rate, regular rhythm. Normal and symmetric distal pulses are present in all extremities. No murmurs, rubs, or gallops. Respiratory: Normal respiratory effort without tachypnea nor retractions. Breath sounds are clear and equal bilaterally. No wheezes/rales/rhonchi. Gastrointestinal: Soft and nontender. No distention. No abdominal bruits.  Musculoskeletal: Nontender with normal range of motion in all extremities. No joint effusions.  No  lower extremity tenderness nor edema. Neurologic:  Normal speech and language. No gross focal neurologic deficits are appreciated. Speech is normal. No gait instability. Skin:  Skin is warm, dry and intact. No rash noted. Psychiatric: Mood and affect are normal. Speech and behavior are normal. Patient exhibits appropriate insight and judgment. ____________________________________________  EKG: Interpreted by me. Normal sinus rhythm with a rate of 62 bpm, normal axis, no evidence of hypertrophy, normal intervals.  ____________________________________________  ED COURSE:  Pertinent labs & imaging results that were available during my care of the patient were reviewed by me and considered in my medical decision making (see chart for details). We'll check basic labs, 2 by mouth Zofran and reevaluate. ____________________________________________    LABS (pertinent positives/negatives)  Labs Reviewed  BASIC METABOLIC PANEL - Abnormal; Notable for the following:    Potassium 3.2 (*)    Glucose, Bld 155 (*)    All other components within normal limits  CBC - Abnormal; Notable for the following:    Platelets 450 (*)    All other components within normal limits  URINALYSIS COMPLETEWITH MICROSCOPIC ([facility] ONLY) - Abnormal; Notable for the following:    Color, Urine YELLOW (*)    APPearance CLEAR (*)    Squamous Epithelial / LPF 6-30 (*)    All other components within normal limits  GLUCOSE, CAPILLARY - Abnormal; Notable for the following:    Glucose-Capillary 147 (*)    All other components within normal limits  CBG MONITORING, ED    FINAL ASSESSMENT AND PLAN  Nausea, weakness  Plan: Patient with labs as dictated above. Patient feeling better, wants to eat. She'll be discharged with antiemetics and close follow-up with her doctor. Likely viral etiology.   Earleen Newport, MD   Earleen Newport, MD 04/27/15 [phone]

## 2015-05-02 ENCOUNTER — Encounter: Admission: RE | Payer: Self-pay | Source: Ambulatory Visit

## 2015-05-02 ENCOUNTER — Ambulatory Visit: Admission: RE | Admit: 2015-05-02 | Payer: Medicaid Other | Source: Ambulatory Visit | Admitting: Gastroenterology

## 2015-05-02 SURGERY — COLONOSCOPY WITH PROPOFOL
Anesthesia: General

## 2015-05-31 ENCOUNTER — Ambulatory Visit: Payer: Medicaid Other | Admitting: Family Medicine

## 2015-06-20 ENCOUNTER — Ambulatory Visit: Payer: Medicaid Other | Admitting: Family Medicine

## 2015-06-22 ENCOUNTER — Ambulatory Visit: Payer: Medicaid Other

## 2015-06-22 ENCOUNTER — Encounter: Payer: Medicaid Other | Admitting: Pharmacist

## 2015-07-03 ENCOUNTER — Ambulatory Visit: Payer: Medicaid Other | Admitting: Family Medicine

## 2015-09-28 ENCOUNTER — Emergency Department
Admission: EM | Admit: 2015-09-28 | Discharge: 2015-09-28 | Disposition: A | Discharge status: Home / Self Care | Payer: Medicaid Other | Attending: Emergency Medicine | Admitting: Emergency Medicine

## 2015-09-28 ENCOUNTER — Encounter: Payer: Self-pay | Admitting: Emergency Medicine

## 2015-09-28 DIAGNOSIS — Z79899 Other long term (current) drug therapy: Secondary | ICD-10-CM | POA: Insufficient documentation

## 2015-09-28 DIAGNOSIS — Z794 Long term (current) use of insulin: Secondary | ICD-10-CM | POA: Insufficient documentation

## 2015-09-28 DIAGNOSIS — F17210 Nicotine dependence, cigarettes, uncomplicated: Secondary | ICD-10-CM | POA: Insufficient documentation

## 2015-09-28 DIAGNOSIS — Z7984 Long term (current) use of oral hypoglycemic drugs: Secondary | ICD-10-CM | POA: Diagnosis not present

## 2015-09-28 DIAGNOSIS — E1165 Type 2 diabetes mellitus with hyperglycemia: Secondary | ICD-10-CM | POA: Insufficient documentation

## 2015-09-28 DIAGNOSIS — R42 Dizziness and giddiness: Secondary | ICD-10-CM | POA: Diagnosis present

## 2015-09-28 DIAGNOSIS — Z760 Encounter for issue of repeat prescription: Secondary | ICD-10-CM | POA: Diagnosis not present

## 2015-09-28 DIAGNOSIS — R739 Hyperglycemia, unspecified: Secondary | ICD-10-CM

## 2015-09-28 DIAGNOSIS — Z791 Long term (current) use of non-steroidal anti-inflammatories (NSAID): Secondary | ICD-10-CM | POA: Diagnosis not present

## 2015-09-28 LAB — CBC
HCT: 39.4 % (ref 35.0–47.0)
HEMOGLOBIN: 13.3 g/dL (ref 12.0–16.0)
MCH: 27.1 pg (ref 26.0–34.0)
MCHC: 33.8 g/dL (ref 32.0–36.0)
MCV: 80.1 fL (ref 80.0–100.0)
PLATELETS: 455 10*3/uL — AB (ref 150–440)
RBC: 4.91 MIL/uL (ref 3.80–5.20)
RDW: 14.1 % (ref 11.5–14.5)
WBC: 9.2 10*3/uL (ref 3.6–11.0)

## 2015-09-28 LAB — BASIC METABOLIC PANEL
ANION GAP: 7 (ref 5–15)
BUN: 12 mg/dL (ref 6–20)
CALCIUM: 9.6 mg/dL (ref 8.9–10.3)
CHLORIDE: 101 mmol/L (ref 101–111)
CO2: 27 mmol/L (ref 22–32)
CREATININE: 0.75 mg/dL (ref 0.44–1.00)
GFR calc non Af Amer: >60 mL/min (ref >60)
Glucose, Bld: 359 mg/dL — ABNORMAL HIGH (ref 65–99)
Potassium: 3.5 mmol/L (ref 3.5–5.1)
SODIUM: 135 mmol/L (ref 135–145)

## 2015-09-28 LAB — URINALYSIS COMPLETE WITH MICROSCOPIC (ARMC ONLY)
Bacteria, UA: NONE SEEN
Bilirubin Urine: NEGATIVE
Glucose, UA: 150 mg/dL — AB
HGB URINE DIPSTICK: NEGATIVE
KETONES UR: NEGATIVE mg/dL
LEUKOCYTES UA: NEGATIVE
NITRITE: NEGATIVE
PH: 7 (ref 5.0–8.0)
PROTEIN: NEGATIVE mg/dL
SPECIFIC GRAVITY, URINE: 1.009 (ref 1.005–1.030)
WBC UA: NONE SEEN WBC/hpf (ref 0–5)

## 2015-09-28 LAB — GLUCOSE, CAPILLARY
GLUCOSE-CAPILLARY: 358 mg/dL — AB (ref 65–99)
GLUCOSE-CAPILLARY: 296 mg/dL — AB (ref 65–99)

## 2015-09-28 MED ORDER — CITALOPRAM HYDROBROMIDE 20 MG PO TABS: 20.0000 mg | ORAL_TABLET | Freq: Every day | ORAL | Status: DC

## 2015-09-28 MED ORDER — CITALOPRAM HYDROBROMIDE 20 MG PO TABS
20.0000 mg | ORAL_TABLET | ORAL | Status: AC
  Administered 2015-09-28: 20 mg via ORAL
  Filled 2015-09-28: qty 1

## 2015-09-28 MED ORDER — SODIUM CHLORIDE 0.9 % IV BOLUS (SEPSIS)
1000.0000 mL | Freq: Once | INTRAVENOUS | Status: AC
  Administered 2015-09-28: 1000 mL via INTRAVENOUS

## 2015-09-28 MED ORDER — TRAZODONE HCL 100 MG PO TABS: 100.0000 mg | ORAL_TABLET | Freq: Every day | ORAL | Status: DC

## 2015-09-28 NOTE — ED Notes (Signed)
MD at bedside. 

## 2015-09-28 NOTE — Discharge Instructions (Signed)
Blood Glucose Monitoring, Adult °Monitoring your blood glucose (also know as blood sugar) helps you to manage your diabetes. It also helps you and your health care provider monitor your diabetes and determine how well your treatment plan is working. °WHY SHOULD YOU MONITOR YOUR BLOOD GLUCOSE? °· It can help you understand how food, exercise, and medicine affect your blood glucose. °· It allows you to know what your blood glucose is at any given moment. You can quickly tell if you are having low blood glucose (hypoglycemia) or high blood glucose (hyperglycemia). °· It can help you and your health care provider know how to adjust your medicines. °· It can help you understand how to manage an illness or adjust medicine for exercise. °WHEN SHOULD YOU TEST? °Your health care provider will help you decide how often you should check your blood glucose. This may depend on the type of diabetes you have, your diabetes control, or the types of medicines you are taking. Be sure to write down all of your blood glucose readings so that this information can be reviewed with your health care provider. See below for examples of testing times that your health care provider may suggest. °Type 1 Diabetes °· Test at least 2 times per day if your diabetes is well controlled, if you are using an insulin pump, or if you perform multiple daily injections. °· If your diabetes is not well controlled or if you are sick, you may need to test more often. °· It is a good idea to also test: °¨ Before every insulin injection. °¨ Before and after exercise. °¨ Between meals and 2 hours after a meal. °¨ Occasionally between 2:00 a.m. and 3:00 a.m. °Type 2 Diabetes °· If you are taking insulin, test at least 2 times per day. However, it is best to test before every insulin injection. °· If you take medicines by mouth (orally), test 2 times a day. °· If you are on a controlled diet, test once a day. °· If your diabetes is not well controlled or if you  are sick, you may need to monitor more often. °HOW TO MONITOR YOUR BLOOD GLUCOSE °Supplies Needed °· Blood glucose meter. °· Test strips for your meter. Each meter has its own strips. You must use the strips that go with your own meter. °· A pricking needle (lancet). °· A device that holds the lancet (lancing device). °· A journal or log book to write down your results. °Procedure °· Wash your hands with soap and water. Alcohol is not preferred. °· Prick the side of your finger (not the tip) with the lancet. °· Gently milk the finger until a small drop of blood appears. °· Follow the instructions that come with your meter for inserting the test strip, applying blood to the strip, and using your blood glucose meter. °Other Areas to Get Blood for Testing °Some meters allow you to use other areas of your body (other than your finger) to test your blood. These areas are called alternative sites. The most common alternative sites are: °· The forearm. °· The thigh. °· The back area of the lower leg. °· The palm of the hand. °The blood flow in these areas is slower. Therefore, the blood glucose values you get may be delayed, and the numbers are different from what you would get from your fingers. Do not use alternative sites if you think you are having hypoglycemia. Your reading will not be accurate. Always use a finger if you are   having hypoglycemia. Also, if you cannot feel your lows (hypoglycemia unawareness), always use your fingers for your blood glucose checks. °ADDITIONAL TIPS FOR GLUCOSE MONITORING °· Do not reuse lancets. °· Always carry your supplies with you. °· All blood glucose meters have a 24-hour "hotline" number to call if you have questions or need help. °· Adjust (calibrate) your blood glucose meter with a control solution after finishing a few boxes of strips. °BLOOD GLUCOSE RECORD KEEPING °It is a good idea to keep a daily record or log of your blood glucose readings. Most glucose meters, if not all,  keep your glucose records stored in the meter. Some meters come with the ability to download your records to your home computer. Keeping a record of your blood glucose readings is especially helpful if you are wanting to look for patterns. Make notes to go along with the blood glucose readings because you might forget what happened at that exact time. Keeping good records helps you and your health care provider to work together to achieve good diabetes management.  °  °This information is not intended to replace advice given to you by your health care provider. Make sure you discuss any questions you have with your health care provider. °  °Document Released: 08/29/2003 Document Revised: 09/16/2014 Document Reviewed: 01/18/2013 °Elsevier Interactive Patient Education ©2016 [employer] ° °Hyperglycemia °Hyperglycemia occurs when the glucose (sugar) in your blood is too high. Hyperglycemia can happen for many reasons, but it most often happens to people who do not know they have diabetes or are not managing their diabetes properly.  °CAUSES  °Whether you have diabetes or not, there are other causes of hyperglycemia. Hyperglycemia can occur when you have diabetes, but it can also occur in other situations that you might not be as aware of, such as: °Diabetes °· If you have diabetes and are having problems controlling your blood glucose, hyperglycemia could occur because of some of the following reasons: °¨ Not following your meal plan. °¨ Not taking your diabetes medications or not taking it properly. °¨ Exercising less or doing less activity than you normally do. °¨ Being sick. °Pre-diabetes °· This cannot be ignored. Before people develop Type 2 diabetes, they almost always have "pre-diabetes." This is when your blood glucose levels are higher than normal, but not yet high enough to be diagnosed as diabetes. Research has shown that some long-term damage to the body, especially the heart and circulatory system, may  already be occurring during pre-diabetes. If you take action to manage your blood glucose when you have pre-diabetes, you may delay or prevent Type 2 diabetes from developing. °Stress °· If you have diabetes, you may be "diet" controlled or on oral medications or insulin to control your diabetes. However, you may find that your blood glucose is higher than usual in the hospital whether you have diabetes or not. This is often referred to as "stress hyperglycemia." Stress can elevate your blood glucose. This happens because of hormones put out by the body during times of stress. If stress has been the cause of your high blood glucose, it can be followed regularly by your caregiver. That way he/she can make sure your hyperglycemia does not continue to get worse or progress to diabetes. °Steroids °· Steroids are medications that act on the infection fighting system (immune system) to block inflammation or infection. One side effect can be a rise in blood glucose. Most people can produce enough extra insulin to allow for this rise, but   for those who cannot, steroids make blood glucose levels go even higher. It is not unusual for steroid treatments to "uncover" diabetes that is developing. It is not always possible to determine if the hyperglycemia will go away after the steroids are stopped. A special blood test called an A1c is sometimes done to determine if your blood glucose was elevated before the steroids were started. °SYMPTOMS °· Thirsty. °· Frequent urination. °· Dry mouth. °· Blurred vision. °· Tired or fatigue. °· Weakness. °· Sleepy. °· Tingling in feet or leg. °DIAGNOSIS  °Diagnosis is made by monitoring blood glucose in one or all of the following ways: °· A1c test. This is a chemical found in your blood. °· Fingerstick blood glucose monitoring. °· Laboratory results. °TREATMENT  °First, knowing the cause of the hyperglycemia is important before the hyperglycemia can be treated. Treatment may include, but is  not be limited to: °· Education. °· Change or adjustment in medications. °· Change or adjustment in meal plan. °· Treatment for an illness, infection, etc. °· More frequent blood glucose monitoring. °· Change in exercise plan. °· Decreasing or stopping steroids. °· Lifestyle changes. °HOME CARE INSTRUCTIONS  °· Test your blood glucose as directed. °· Exercise regularly. Your caregiver will give you instructions about exercise. Pre-diabetes or diabetes which comes on with stress is helped by exercising. °· Eat wholesome, balanced meals. Eat often and at regular, fixed times. Your caregiver or nutritionist will give you a meal plan to guide your sugar intake. °· Being at an ideal weight is important. If needed, losing as little as 10 to 15 pounds may help improve blood glucose levels. °SEEK MEDICAL CARE IF:  °· You have questions about medicine, activity, or diet. °· You continue to have symptoms (problems such as increased thirst, urination, or weight gain). °SEEK IMMEDIATE MEDICAL CARE IF:  °· You are vomiting or have diarrhea. °· Your breath smells fruity. °· You are breathing faster or slower. °· You are very sleepy or incoherent. °· You have numbness, tingling, or pain in your feet or hands. °· You have chest pain. °· Your symptoms get worse even though you have been following your caregiver's orders. °· If you have any other questions or concerns. °  °This information is not intended to replace advice given to you by your health care provider. Make sure you discuss any questions you have with your health care provider. °  °Document Released: 02/19/2001 Document Revised: 11/18/2011 Document Reviewed: 05/02/2015 °Elsevier Interactive Patient Education ©2016 [employer] Inc. ° °

## 2015-09-28 NOTE — ED Notes (Signed)
Pt reports running out of celexa and trazadone 4 days ago. Pt has appointment with PCP on Monday. ED MD aware.

## 2015-09-28 NOTE — ED Notes (Addendum)
Pt started feeling dizzy, "swimmy headed" since this am. Denies pain. Exposure to URI first of week. FSBS 358. Pt also takes citalopram x 1 year and ran out 2 days ago.

## 2015-09-28 NOTE — ED Provider Notes (Signed)
[facility] Emergency Department Provider Note  ____________________________________________  Time seen: 7:05 PM  I have reviewed the triage vital signs and the nursing notes.   HISTORY  Chief Complaint Dizziness    HPI Faith Smith is a 55 y.o. female who complains of dizziness described as vertigo with everything moving that started this morning when she got up. It's worse with changes in position and head movement. She feels like this when her blood sugar is high. She's been taking her diabetes medications including oral and insulin control, but she ran out of her Celexa and trazodone 2 days ago. She has a follow-up appointment with her primary care doctor in 4 days but no way to get her medications before then. Denies any SI or HI or hallucinations. No chest pain shortness of breath abdominal pain or vomiting. Just dizzy. This tingling weakness or vision changes. No falls or trauma or syncope.  With some IV fluid she's reports that she is starting to feel better.   Past Medical History  Diagnosis Date  . Anxiety   . Arthritis   . Blood transfusion without reported diagnosis     previously  . Depression   . Diabetes mellitus without complication ([place])   . GERD (gastroesophageal reflux disease)   . Hyperlipidemia      Patient Active Problem List   Diagnosis Date Noted  . Osteoarthrosis multiple sites, not specified as generalized 04/14/2015  . Annual physical exam 04/14/2015  . Breast cancer screening 04/14/2015  . Breast mass, left 04/14/2015  . Anxiety and depression 02/20/2015  . Diabetes mellitus type 2, controlled, without complications ([place]) [id number]  . GERD (gastroesophageal reflux disease) 02/20/2015  . Hypercholesterolemia with hypertriglyceridemia 02/20/2015  . Arthralgia of multiple joints 02/20/2015  . Extreme obesity ([place]) 02/20/2015  . Vitamin D deficiency 02/20/2015  . Nausea without vomiting 02/20/2015  . Insomnia  02/20/2015  . Diabetes ([place]) 03/15/2014  . Adiposity 03/15/2014  . Current smoker 03/15/2014  . Abdominal pain 12/15/2012     Past Surgical History  Procedure Laterality Date  . Cholecystectomy    . Cesarean section      had 3     Current Outpatient Rx  Name  Route  Sig  Dispense  Refill  . ACCU-CHEK FASTCLIX LANCETS MISC   Does not apply   1 Device by Does not apply route 3 (three) times daily.   100 each   5   . cholecalciferol (VITAMIN D) 1000 UNITS tablet   Oral   Take 1,000 Units by mouth daily.         . citalopram (CELEXA) 20 MG tablet   Oral   Take 1 tablet (20 mg total) by mouth daily.   30 tablet   0   . cyclobenzaprine (FLEXERIL) 5 MG tablet   Oral   Take 1 tablet (5 mg total) by mouth at bedtime.   90 tablet   1   . Insulin Glargine (LANTUS SOLOSTAR) 100 UNIT/ML Solostar Pen   Subcutaneous   Inject 30 Units into the skin at bedtime.   3 pen   5     30 day supply   . meloxicam (MOBIC) 15 MG tablet   Oral   Take 1 tablet (15 mg total) by mouth daily.   30 tablet   2   . metFORMIN (GLUCOPHAGE) 1000 MG tablet   Oral   Take by mouth.         . naproxen (NAPROSYN) 500 MG  tablet   Oral   Take 1 tablet (500 mg total) by mouth 2 (two) times daily with a meal.   60 tablet   5   . Omeprazole 20 MG TBEC   Oral   Take 1 tablet (20 mg total) by mouth daily.   90 each   2   . ondansetron (ZOFRAN ODT) 4 MG disintegrating tablet   Oral   Take 1 tablet (4 mg total) by mouth every 8 (eight) hours as needed for nausea or vomiting.   20 tablet   0   . ondansetron (ZOFRAN) 4 MG tablet   Oral   Take 1 tablet (4 mg total) by mouth every 8 (eight) hours as needed for nausea or vomiting.   30 tablet   2   . pravastatin (PRAVACHOL) 20 MG tablet   Oral   Take 1 tablet (20 mg total) by mouth at bedtime.   90 tablet   1     DISCONTINUE PREVIOUS RX PRAVASTATIN 10MG    . predniSONE (STERAPRED UNI-PAK 21 TAB) 10 MG (21) TBPK tablet      Use as  directed in a 6 day taper predpak   21 tablet   0   . SitaGLIPtin-MetFORMIN HCl (JANUMET XR) 50-1000 MG TB24   Oral   Take 1 tablet by mouth daily.   90 tablet   2   . traZODone (DESYREL) 100 MG tablet   Oral   Take 1 tablet (100 mg total) by mouth at bedtime.   14 tablet   0   . Vitamin D, Ergocalciferol, (DRISDOL) 50000 UNITS CAPS capsule   Oral   Take 1 capsule (50,000 Units total) by mouth every 7 (seven) days.   12 capsule   0      Allergies Atorvastatin and Percocet   Family History  Problem Relation Age of Onset  . Cancer Mother   . Hypertension Mother   . Depression Brother     Social History Social History  Substance Use Topics  . Smoking status: Current Every Day Smoker    Types: Cigarettes  . Smokeless tobacco: None     Comment: patient states she is working on it now  . Alcohol Use: No    Review of Systems  Constitutional:   No fever or chills. No weight changes Eyes:   No blurry vision or double vision.  ENT:   No sore throat. Cardiovascular:   No chest pain. Respiratory:   No dyspnea or cough. Gastrointestinal:   Negative for abdominal pain, vomiting and diarrhea.  No BRBPR or melena. Genitourinary:   Negative for dysuria, urinary retention, bloody urine, or difficulty urinating. Musculoskeletal:   Negative for back pain. No joint swelling or pain. Skin:   Negative for rash. Neurological:   Negative for headaches, focal weakness or numbness. Psychiatric:  No anxiety or depression.   Endocrine:  No hot/cold intolerance, changes in energy, or sleep difficulty.  10-point ROS otherwise negative.  ____________________________________________   PHYSICAL EXAM:  VITAL SIGNS: ED Triage Vitals  Enc Vitals Group     BP 09/28/15 1709 127/60 mmHg     Pulse Rate 09/28/15 1709 85     Resp 09/28/15 2034 13     Temp 09/28/15 1709 98.3 F (36.8 C)     Temp Source 09/28/15 1709 Oral     SpO2 09/28/15 1709 98 %     Weight 09/28/15 1709 210 lb  (95.255 kg)     Height 09/28/15 1709 5' 7.5" (  1.715 m)     Head Cir --      Peak Flow --      Pain Score --      Pain Loc --      Pain Edu? --      Excl. in [place]? --     Vital signs reviewed, nursing assessments reviewed.   Constitutional:   Alert and oriented. Well appearing and in no distress. Eyes:   No scleral icterus. No conjunctival pallor. PERRL. EOMI, no nystagmus ENT   Head:   Normocephalic and atraumatic.   Nose:   No congestion/rhinnorhea. No septal hematoma   Mouth/Throat:   MMM, no pharyngeal erythema. No peritonsillar mass. No uvula shift.   Neck:   No stridor. No SubQ emphysema. No meningismus. Hematological/Lymphatic/Immunilogical:   No cervical lymphadenopathy. Cardiovascular:   RRR. Normal and symmetric distal pulses are present in all extremities. No murmurs, rubs, or gallops. Respiratory:   Normal respiratory effort without tachypnea nor retractions. Breath sounds are clear and equal bilaterally. No wheezes/rales/rhonchi. Gastrointestinal:   Soft and nontender. No distention. There is no CVA tenderness.  No rebound, rigidity, or guarding. Genitourinary:   deferred Musculoskeletal:   Nontender with normal range of motion in all extremities. No joint effusions.  No lower extremity tenderness.  No edema. Neurologic:   Normal speech and language.  CN 2-10 normal. Motor grossly intact. No pronator drift.  Normal gait. No gross focal neurologic deficits are appreciated.  Skin:    Skin is warm, dry and intact. No rash noted.  No petechiae, purpura, or bullae. Psychiatric:   Mood and affect are normal. Speech and behavior are normal. Patient exhibits appropriate insight and judgment.  ____________________________________________    LABS (pertinent positives/negatives) (all labs ordered are listed, but only abnormal results are displayed) Labs Reviewed  GLUCOSE, CAPILLARY - Abnormal; Notable for the following:    Glucose-Capillary 358 (*)    All other  components within normal limits  BASIC METABOLIC PANEL - Abnormal; Notable for the following:    Glucose, Bld 359 (*)    All other components within normal limits  CBC - Abnormal; Notable for the following:    Platelets 455 (*)    All other components within normal limits  URINALYSIS COMPLETEWITH MICROSCOPIC ([facility] ONLY) - Abnormal; Notable for the following:    Color, Urine STRAW (*)    APPearance CLEAR (*)    Glucose, UA 150 (*)    Squamous Epithelial / LPF 0-5 (*)    All other components within normal limits  CBG MONITORING, ED  CBG MONITORING, ED   ____________________________________________   EKG  Interpreted by me Sinus rhythm rate of 68, normal axis and intervals. Normal QRS and ST segments and T waves  ____________________________________________    RADIOLOGY    ____________________________________________   PROCEDURES   ____________________________________________   INITIAL IMPRESSION / ASSESSMENT AND PLAN / ED COURSE  Pertinent labs & imaging results that were available during my care of the patient were reviewed by me and considered in my medical decision making (see chart for details).  Patient appears to be medically stable. Appears to have vertigo related to dehydration and hyperglycemia. We'll rehydrate with IV fluids.  ----------------------------------------- 9:49 PM on 09/28/2015 -----------------------------------------  No evidence of acidosis. No urinary tract infection or other concerns. Isolated hyperglycemia. We'll encourage the patient to continue all her diabetes management medications. I will refill her Celexa and trazodone to help keep her chronic mental health issues at baseline until she can follow-up  with her doctor. Overall well-appearing no acute distress. Low suspicion for stroke or intracranial hemorrhage, meningitis encephalitis sepsis intracranial hypertension glaucoma or temporal arteritis or dural sinus  thrombosis.     ____________________________________________   FINAL CLINICAL IMPRESSION(S) / ED DIAGNOSES  Final diagnoses:  Hyperglycemia  Medication refill      Carrie Mew, MD 09/28/15 2150

## 2015-12-16 ENCOUNTER — Emergency Department
Admission: EM | Admit: 2015-12-16 | Discharge: 2015-12-16 | Disposition: A | Discharge status: Home / Self Care | Payer: Medicaid Other | Attending: Emergency Medicine | Admitting: Emergency Medicine

## 2015-12-16 DIAGNOSIS — Z791 Long term (current) use of non-steroidal anti-inflammatories (NSAID): Secondary | ICD-10-CM | POA: Insufficient documentation

## 2015-12-16 DIAGNOSIS — E119 Type 2 diabetes mellitus without complications: Secondary | ICD-10-CM | POA: Insufficient documentation

## 2015-12-16 DIAGNOSIS — Z794 Long term (current) use of insulin: Secondary | ICD-10-CM | POA: Diagnosis not present

## 2015-12-16 DIAGNOSIS — F17210 Nicotine dependence, cigarettes, uncomplicated: Secondary | ICD-10-CM | POA: Insufficient documentation

## 2015-12-16 DIAGNOSIS — R109 Unspecified abdominal pain: Secondary | ICD-10-CM | POA: Diagnosis not present

## 2015-12-16 DIAGNOSIS — E7800 Pure hypercholesterolemia, unspecified: Secondary | ICD-10-CM | POA: Insufficient documentation

## 2015-12-16 DIAGNOSIS — M549 Dorsalgia, unspecified: Secondary | ICD-10-CM | POA: Diagnosis present

## 2015-12-16 DIAGNOSIS — E6609 Other obesity due to excess calories: Secondary | ICD-10-CM | POA: Insufficient documentation

## 2015-12-16 DIAGNOSIS — M1990 Unspecified osteoarthritis, unspecified site: Secondary | ICD-10-CM | POA: Diagnosis not present

## 2015-12-16 DIAGNOSIS — Z7952 Long term (current) use of systemic steroids: Secondary | ICD-10-CM | POA: Insufficient documentation

## 2015-12-16 DIAGNOSIS — Z7984 Long term (current) use of oral hypoglycemic drugs: Secondary | ICD-10-CM | POA: Insufficient documentation

## 2015-12-16 DIAGNOSIS — F329 Major depressive disorder, single episode, unspecified: Secondary | ICD-10-CM | POA: Insufficient documentation

## 2015-12-16 DIAGNOSIS — K219 Gastro-esophageal reflux disease without esophagitis: Secondary | ICD-10-CM | POA: Diagnosis not present

## 2015-12-16 LAB — URINALYSIS COMPLETE WITH MICROSCOPIC (ARMC ONLY)
BILIRUBIN URINE: NEGATIVE
Glucose, UA: NEGATIVE mg/dL
Hgb urine dipstick: NEGATIVE
KETONES UR: NEGATIVE mg/dL
LEUKOCYTES UA: NEGATIVE
NITRITE: NEGATIVE
PH: 5 (ref 5.0–8.0)
PROTEIN: NEGATIVE mg/dL
SPECIFIC GRAVITY, URINE: 1.01 (ref 1.005–1.030)

## 2015-12-16 MED ORDER — METHOCARBAMOL 750 MG PO TABS: 750.0000 mg | ORAL_TABLET | Freq: Four times a day (QID) | ORAL | Status: DC

## 2015-12-16 MED ORDER — HYDROMORPHONE HCL 1 MG/ML IJ SOLN
1.0000 mg | Freq: Once | INTRAMUSCULAR | Status: AC
  Administered 2015-12-16: 1 mg via INTRAMUSCULAR
  Filled 2015-12-16: qty 1

## 2015-12-16 MED ORDER — METHOCARBAMOL 500 MG PO TABS
1000.0000 mg | ORAL_TABLET | Freq: Once | ORAL | Status: AC
  Administered 2015-12-16: 1000 mg via ORAL
  Filled 2015-12-16: qty 2

## 2015-12-16 MED ORDER — ONDANSETRON 4 MG PO TBDP
4.0000 mg | ORAL_TABLET | Freq: Once | ORAL | Status: AC
  Administered 2015-12-16: 4 mg via ORAL
  Filled 2015-12-16: qty 1

## 2015-12-16 MED ORDER — IBUPROFEN 800 MG PO TABS: 800.0000 mg | ORAL_TABLET | Freq: Three times a day (TID) | ORAL | Status: DC | PRN

## 2015-12-16 NOTE — ED Notes (Signed)
Pt denies injury to back and denies urinary sx. Pt reports pain first noticed yesterday.

## 2015-12-16 NOTE — Discharge Instructions (Signed)
Flank Pain °Flank pain is pain in your side. The flank is the area of your side between your upper belly (abdomen) and your back. Pain in this area can be caused by many different things. °HOME CARE °Home care and treatment will depend on the cause of your pain. °· Rest as told by your doctor. °· Drink enough fluids to keep your pee (urine) clear or pale yellow.   °· Only take medicine as told by your doctor. °· Tell your doctor about any changes in your pain. °· Follow up with your doctor. °GET HELP RIGHT AWAY IF:  °· Your pain does not get better with medicine.   °· You have new symptoms or your symptoms get worse. °· Your pain gets worse.   °· You have belly (abdominal) pain.   °· You are short of breath.   °· You always feel sick to your stomach (nauseous).   °· You keep throwing up (vomiting).   °· You have puffiness (swelling) in your belly.   °· You feel light-headed or you pass out (faint).   °· You have blood in your pee. °· You have a fever or lasting symptoms for more than 2-3 days. °· You have a fever and your symptoms suddenly get worse. °MAKE SURE YOU:  °· Understand these instructions. °· Will watch your condition. °· Will get help right away if you are not doing well or get worse. °  °This information is not intended to replace advice given to you by your health care provider. Make sure you discuss any questions you have with your health care provider. °  °Document Released: 06/04/2008 Document Revised: 09/16/2014 Document Reviewed: 04/09/2012 °Elsevier Interactive Patient Education ©2016 [employer]. ° °

## 2015-12-16 NOTE — ED Notes (Signed)
Patient to ER for right lower back pain. States she was doing some work at [employer] today and doesn't know if she irritated area, but denies any falls or injuries. Patient ambulatory to wheelchair, but with difficulty.

## 2015-12-16 NOTE — ED Notes (Signed)
Pt taken to BR via wheelchair for urine specimen collection

## 2015-12-16 NOTE — ED Provider Notes (Signed)
[facility] Emergency Department Provider Note  ____________________________________________  Time seen: Approximately 9:28 PM  I have reviewed the triage vital signs and the nursing notes.   HISTORY  Chief Complaint Back Pain    HPI Faith Smith is a 55 y.o. female is complaining of right flank pain for 1 day. Patient states working at [employer] when she first noticed mild pain. Patient stated the pain disappeared and she went to bed last night but upon awakening return. Patient denies any radicular component to this pain she denies any bladder or bowel dysfunction. Patient stated there is no fever with this complaint. Patient is rating her pain as 8/10. Patient described the pain as "achy". No palliative measures taken for this complaint.  Past Medical History  Diagnosis Date  . Anxiety   . Arthritis   . Blood transfusion without reported diagnosis     previously  . Depression   . Diabetes mellitus without complication ([place])   . GERD (gastroesophageal reflux disease)   . Hyperlipidemia     Patient Active Problem List   Diagnosis Date Noted  . Osteoarthrosis multiple sites, not specified as generalized 04/14/2015  . Annual physical exam 04/14/2015  . Breast cancer screening 04/14/2015  . Breast mass, left 04/14/2015  . Anxiety and depression 02/20/2015  . Diabetes mellitus type 2, controlled, without complications ([place]) [id number]  . GERD (gastroesophageal reflux disease) 02/20/2015  . Hypercholesterolemia with hypertriglyceridemia 02/20/2015  . Arthralgia of multiple joints 02/20/2015  . Extreme obesity ([place]) 02/20/2015  . Vitamin D deficiency 02/20/2015  . Nausea without vomiting 02/20/2015  . Insomnia 02/20/2015  . Diabetes ([place]) 03/15/2014  . Adiposity 03/15/2014  . Current smoker 03/15/2014  . Abdominal pain 12/15/2012    Past Surgical History  Procedure Laterality Date  . Cholecystectomy    . Cesarean section      had 3    Current  Outpatient Rx  Name  Route  Sig  Dispense  Refill  . ACCU-CHEK FASTCLIX LANCETS MISC   Does not apply   1 Device by Does not apply route 3 (three) times daily.   100 each   5   . cholecalciferol (VITAMIN D) 1000 UNITS tablet   Oral   Take 1,000 Units by mouth daily.         . citalopram (CELEXA) 20 MG tablet   Oral   Take 1 tablet (20 mg total) by mouth daily.   30 tablet   0   . cyclobenzaprine (FLEXERIL) 5 MG tablet   Oral   Take 1 tablet (5 mg total) by mouth at bedtime.   90 tablet   1   . Insulin Glargine (LANTUS SOLOSTAR) 100 UNIT/ML Solostar Pen   Subcutaneous   Inject 30 Units into the skin at bedtime.   3 pen   5     30 day supply   . meloxicam (MOBIC) 15 MG tablet   Oral   Take 1 tablet (15 mg total) by mouth daily.   30 tablet   2   . metFORMIN (GLUCOPHAGE) 1000 MG tablet   Oral   Take by mouth.         . naproxen (NAPROSYN) 500 MG tablet   Oral   Take 1 tablet (500 mg total) by mouth 2 (two) times daily with a meal.   60 tablet   5   . Omeprazole 20 MG TBEC   Oral   Take 1 tablet (20 mg total) by mouth daily.  90 each   2   . ondansetron (ZOFRAN ODT) 4 MG disintegrating tablet   Oral   Take 1 tablet (4 mg total) by mouth every 8 (eight) hours as needed for nausea or vomiting.   20 tablet   0   . ondansetron (ZOFRAN) 4 MG tablet   Oral   Take 1 tablet (4 mg total) by mouth every 8 (eight) hours as needed for nausea or vomiting.   30 tablet   2   . pravastatin (PRAVACHOL) 20 MG tablet   Oral   Take 1 tablet (20 mg total) by mouth at bedtime.   90 tablet   1     DISCONTINUE PREVIOUS RX PRAVASTATIN 10MG    . predniSONE (STERAPRED UNI-PAK 21 TAB) 10 MG (21) TBPK tablet      Use as directed in a 6 day taper predpak   21 tablet   0   . SitaGLIPtin-MetFORMIN HCl (JANUMET XR) 50-1000 MG TB24   Oral   Take 1 tablet by mouth daily.   90 tablet   2   . traZODone (DESYREL) 100 MG tablet   Oral   Take 1 tablet (100 mg total) by  mouth at bedtime.   14 tablet   0   . Vitamin D, Ergocalciferol, (DRISDOL) 50000 UNITS CAPS capsule   Oral   Take 1 capsule (50,000 Units total) by mouth every 7 (seven) days.   12 capsule   0     Allergies Atorvastatin and Percocet  Family History  Problem Relation Age of Onset  . Cancer Mother   . Hypertension Mother   . Depression Brother     Social History Social History  Substance Use Topics  . Smoking status: Current Every Day Smoker    Types: Cigarettes  . Smokeless tobacco: Not on file     Comment: patient states she is working on it now  . Alcohol Use: No    Review of Systems Constitutional: No fever/chills Eyes: No visual changes. ENT: No sore throat. Cardiovascular: Denies chest pain. Respiratory: Denies shortness of breath. Gastrointestinal: No abdominal pain.  No nausea, no vomiting.  No diarrhea.  No constipation. Genitourinary: Negative for dysuria. Musculoskeletal: Right flank pain Skin: Negative for rash. Neurological: Negative for headaches, focal weakness or numbness.    ____________________________________________   PHYSICAL EXAM:  VITAL SIGNS: ED Triage Vitals  Enc Vitals Group     BP 12/16/15 2106 114/59 mmHg     Pulse Rate 12/16/15 2106 65     Resp 12/16/15 2106 18     Temp 12/16/15 2106 98 F (36.7 C)     Temp Source 12/16/15 2106 Oral     SpO2 12/16/15 2106 98 %     Weight 12/16/15 2106 200 lb (90.719 kg)     Height 12/16/15 2106 5' 7.5" (1.715 m)     Head Cir --      Peak Flow --      Pain Score --      Pain Loc --      Pain Edu? --      Excl. in [place]? --     Constitutional: Alert and oriented. Well appearing and in no acute distress. Eyes: Conjunctivae are normal. PERRL. EOMI. Head: Atraumatic. Nose: No congestion/rhinnorhea. Mouth/Throat: Mucous membranes are moist.  Oropharynx non-erythematous. Neck: No stridor.  No cervical spine tenderness to palpation. Hematological/Lymphatic/Immunilogical: No cervical  lymphadenopathy. Cardiovascular: Normal rate, regular rhythm. Grossly normal heart sounds.  Good peripheral circulation. Respiratory: Normal respiratory  effort.  No retractions. Lungs CTAB. Gastrointestinal: Soft and nontender. No distention. No abdominal bruits. No CVA tenderness. Musculoskeletal:No obvious spinal deformity. Patient is no guarding with palpation spinal processes. Patient has some moderate guarding palpation the right CVA area.  Patient decreased range of motion with left lateral movements and extension.  Neurologic:  Normal speech and language. No gross focal neurologic deficits are appreciated. No gait instability. Skin:  Skin is warm, dry and intact. No rash noted. Psychiatric: Mood and affect are normal. Speech and behavior are normal.  ____________________________________________   LABS (all labs ordered are listed, but only abnormal results are displayed)  Labs Reviewed  URINALYSIS COMPLETEWITH MICROSCOPIC ([place] ONLY) - Abnormal; Notable for the following:    Color, Urine YELLOW (*)    APPearance CLEAR (*)    Bacteria, UA RARE (*)    Squamous Epithelial / LPF 0-5 (*)    All other components within normal limits   ____________________________________________  EKG   ____________________________________________  RADIOLOGY   ____________________________________________   PROCEDURES  Procedure(s) performed: None  Critical Care performed: No  ____________________________________________   INITIAL IMPRESSION / ASSESSMENT AND PLAN / ED COURSE  Pertinent labs & imaging results that were available during my care of the patient were reviewed by me and considered in my medical decision making (see chart for details).  Right flank pain. This negative urinalysis with patient. Patient given discharge Instructions. Patient given a prescription for Robaxin and ibuprofen. Patient advised follow-up family doctor if no improvement in 2 days. Return by ER if  condition worsens. ____________________________________________   FINAL CLINICAL IMPRESSION(S) / ED DIAGNOSES  Final diagnoses:  Right flank pain      Sable Feil, PA-C 12/16/15 [place], MD 12/16/15 [phone]

## 2015-12-16 NOTE — ED Notes (Signed)
PA at bedside.

## 2015-12-21 ENCOUNTER — Telehealth: Payer: Self-pay | Admitting: Family Medicine

## 2015-12-21 NOTE — Telephone Encounter (Signed)
Appointment made for 12-28-15

## 2015-12-21 NOTE — Telephone Encounter (Signed)
Please call patient and ask her to schedule an appt for her diabetes; we'll get fasting labs then too We'll fill out her Rx for diabetic testing supplies at her visit; have her bring in her glucose machine and readings; thank you

## 2015-12-28 ENCOUNTER — Telehealth: Payer: Self-pay | Admitting: Family Medicine

## 2015-12-28 ENCOUNTER — Ambulatory Visit (INDEPENDENT_AMBULATORY_CARE_PROVIDER_SITE_OTHER): Payer: Medicaid Other | Admitting: Family Medicine

## 2015-12-28 VITALS — BP 118/74 | HR 90 | Temp 99.1°F | Resp 16 | Wt 212.4 lb

## 2015-12-28 DIAGNOSIS — M25512 Pain in left shoulder: Secondary | ICD-10-CM | POA: Diagnosis not present

## 2015-12-28 DIAGNOSIS — M545 Low back pain, unspecified: Secondary | ICD-10-CM

## 2015-12-28 DIAGNOSIS — E119 Type 2 diabetes mellitus without complications: Secondary | ICD-10-CM

## 2015-12-28 DIAGNOSIS — E782 Mixed hyperlipidemia: Secondary | ICD-10-CM

## 2015-12-28 DIAGNOSIS — Z1211 Encounter for screening for malignant neoplasm of colon: Secondary | ICD-10-CM | POA: Diagnosis not present

## 2015-12-28 DIAGNOSIS — M25562 Pain in left knee: Secondary | ICD-10-CM | POA: Diagnosis not present

## 2015-12-28 DIAGNOSIS — M2550 Pain in unspecified joint: Secondary | ICD-10-CM

## 2015-12-28 DIAGNOSIS — G8929 Other chronic pain: Secondary | ICD-10-CM | POA: Diagnosis not present

## 2015-12-28 DIAGNOSIS — Z794 Long term (current) use of insulin: Secondary | ICD-10-CM

## 2015-12-28 DIAGNOSIS — M549 Dorsalgia, unspecified: Secondary | ICD-10-CM | POA: Insufficient documentation

## 2015-12-28 DIAGNOSIS — R159 Full incontinence of feces: Secondary | ICD-10-CM | POA: Diagnosis not present

## 2015-12-28 DIAGNOSIS — M158 Other polyosteoarthritis: Secondary | ICD-10-CM

## 2015-12-28 DIAGNOSIS — M8949 Other hypertrophic osteoarthropathy, multiple sites: Secondary | ICD-10-CM

## 2015-12-28 MED ORDER — INSULIN GLARGINE 100 UNIT/ML SOLOSTAR PEN: 30.0000 [IU] | PEN_INJECTOR | Freq: Every day | SUBCUTANEOUS | Status: DC

## 2015-12-28 MED ORDER — METFORMIN HCL ER 500 MG PO TB24: 1000.0000 mg | ORAL_TABLET | Freq: Every day | ORAL | Status: DC

## 2015-12-28 MED ORDER — PRAVASTATIN SODIUM 20 MG PO TABS: 20.0000 mg | ORAL_TABLET | Freq: Every day | ORAL | Status: DC

## 2015-12-28 MED ORDER — SITAGLIPTIN PHOSPHATE 100 MG PO TABS: 100.0000 mg | ORAL_TABLET | Freq: Every day | ORAL | Status: DC

## 2015-12-28 MED ORDER — ACCU-CHEK FASTCLIX LANCETS MISC: 1.0000 | Freq: Three times a day (TID) | Status: DC

## 2015-12-28 MED ORDER — BLOOD GLUC METER DISP-STRIPS DEVI: 1.0000 | Freq: Three times a day (TID) | Status: DC

## 2015-12-28 MED ORDER — MELOXICAM 15 MG PO TABS: 15.0000 mg | ORAL_TABLET | Freq: Every day | ORAL | Status: DC

## 2015-12-28 NOTE — Telephone Encounter (Signed)
Pharmacy notified.

## 2015-12-28 NOTE — Progress Notes (Signed)
BP 118/74 mmHg  Pulse 90  Temp(Src) 99.1 F (37.3 C) (Oral)  Resp 16  Wt 212 lb 6.4 oz (96.344 kg)  SpO2 97%   Subjective:    Patient ID: Faith Smith, female    DOB: 1961-06-30, 55 y.o.   MRN: [id number]  HPI: Faith Smith is a 55 y.o. female  Chief Complaint  Patient presents with  . Follow-up    urgent care  . Back Pain    Urgent care stated pulled muscle  . Medication Refill  . Arm Pain    left can only raise so far   Patient is new to me, as her previous doctor left this practice  She has arthritis in the back and neck and left shoulder; right-handed 12 days ago, had back pain She was at a fish fry for the church on Friday; there all day long, next morning got up and  No blood in the urine, no kidney stones Sore to the touch No blisters No fevers Pain is achy; right now 4 out of 10 in pain; hurts with movement Back is feeling much better today; no loss of B/B function Note from April 8th reviewed, in the ER, seen by Dr. Jimmye Norman; urine was done, yellow, clear, negative blood on dip, rare bacteria, 0-5 WBCs and RBCs/hpf  Sometimes she will be asleep, sometimes loses control of bowel; going on for a long time; takes a sleeping pill, trazodone; no blood in stool; no fam hx of colon cancer; never had a colonscopy  Diabetes type 2; last visit with Dr. Nadine Counts was actually August 2nd  Bad reflux; does drink coffee  Depression screen [facility] 2/9 12/28/2015 04/14/2015 02/20/2015  Decreased Interest 0 1 3  Down, Depressed, Hopeless 0 0 3  PHQ - 2 Score 0 1 6  Altered sleeping - - 0  Tired, decreased energy - - 3  Change in appetite - - 0  Feeling bad or failure about yourself  - - 0  Trouble concentrating - - 3  Moving slowly or fidgety/restless - - 0  Suicidal thoughts - - 0  PHQ-9 Score - - 12  Difficult doing work/chores - - Very difficult   Relevant past medical, surgical, family and social history reviewed Past Medical History Anxiety, arthritis,  depression, diabetes, hyperlipidemia, GERD  Past Surgical History  Procedure Laterality Date  . Cholecystectomy    . Cesarean section      had 3   Family History  Problem Relation Age of Onset  . Cancer Mother   . Hypertension Mother   . Depression Brother    Social History  Substance Use Topics  . Smoking status: Current Every Day Smoker    Types: Cigarettes  . Smokeless tobacco: Not on file     Comment: patient states she is working on it now  . Alcohol Use: No   Interim medical history since last visit reviewed. Allergies and medications reviewed  Review of Systems Per HPI unless specifically indicated above     Objective:    BP 118/74 mmHg  Pulse 90  Temp(Src) 99.1 F (37.3 C) (Oral)  Resp 16  Wt 212 lb 6.4 oz (96.344 kg)  SpO2 97%  Wt Readings from Last 3 Encounters:  01/19/16 208 lb (94.348 kg)  12/28/15 212 lb 6.4 oz (96.344 kg)  12/16/15 200 lb (90.719 kg)    Physical Exam  Constitutional: She appears well-developed and well-nourished. No distress.  HENT:  Head: Normocephalic and atraumatic.  Eyes:  EOM are normal. No scleral icterus.  Neck: No thyromegaly present.  Cardiovascular: Normal rate, regular rhythm and normal heart sounds.   No murmur heard. Pulmonary/Chest: Effort normal and breath sounds normal. No respiratory distress. She has no wheezes.  Abdominal: Soft. Bowel sounds are normal. She exhibits no distension.  Musculoskeletal: She exhibits no edema.       Left shoulder: She exhibits decreased range of motion and tenderness. She exhibits no swelling and no deformity.  Neurological: She is alert. She exhibits normal muscle tone.  Skin: Skin is warm and dry. She is not diaphoretic. No pallor.  Psychiatric: She has a normal mood and affect. Her behavior is normal. Judgment and thought content normal.    Results for orders placed or performed during the hospital encounter of 12/16/15  Urinalysis complete, with microscopic  Result Value Ref  Range   Color, Urine YELLOW (A) YELLOW   APPearance CLEAR (A) CLEAR   Glucose, UA NEGATIVE NEGATIVE mg/dL   Bilirubin Urine NEGATIVE NEGATIVE   Ketones, ur NEGATIVE NEGATIVE mg/dL   Specific Gravity, Urine 1.010 1.005 - 1.030   Hgb urine dipstick NEGATIVE NEGATIVE   pH 5.0 5.0 - 8.0   Protein, ur NEGATIVE NEGATIVE mg/dL   Nitrite NEGATIVE NEGATIVE   Leukocytes, UA NEGATIVE NEGATIVE   RBC / HPF 0-5 0 - 5 RBC/hpf   WBC, UA 0-5 0 - 5 WBC/hpf   Bacteria, UA RARE (A) NONE SEEN   Squamous Epithelial / LPF 0-5 (A) NONE SEEN   Mucous PRESENT       Assessment & Plan:   Problem List Items Addressed This Visit      Endocrine   Diabetes mellitus type 2, controlled, without complications (HCC)   Relevant Medications   metFORMIN (GLUCOPHAGE-XR) 500 MG 24 hr tablet   sitaGLIPtin (JANUVIA) 100 MG tablet   pravastatin (PRAVACHOL) 20 MG tablet   Insulin Glargine (LANTUS SOLOSTAR) 100 UNIT/ML Solostar Pen   ACCU-CHEK FASTCLIX LANCETS MISC     Musculoskeletal and Integument   Osteoarthrosis multiple sites, not specified as generalized   Relevant Medications   meloxicam (MOBIC) 15 MG tablet     Other   Arthralgia of multiple joints    Will use meloxicam; stretching, physical therapy      Colon cancer screening   Relevant Orders   Ambulatory referral to Gastroenterology   Hypercholesterolemia with hypertriglyceridemia   Relevant Medications   pravastatin (PRAVACHOL) 20 MG tablet   Right-sided back pain - Primary    Refer to physical therapy; note from recent ER visit reviewed; no blood in urine      Relevant Medications   meloxicam (MOBIC) 15 MG tablet   Other Relevant Orders   Ambulatory referral to Physical Therapy   Stool incontinence    Refer to GI      Relevant Orders   Ambulatory referral to Gastroenterology    Other Visit Diagnoses    Chronic shoulder pain, left        Relevant Orders    Ambulatory referral to Orthopedic Surgery    Knee pain, chronic, left         Relevant Orders    Ambulatory referral to Orthopedic Surgery       Follow up plan: Return 1-2 weeks, for fasting labs and visit with Dr. Sanda Klein for diabetes and cholesterol.  An after-visit summary was printed and given to the patient at [facility].  Please see the patient instructions which may contain other information and recommendations beyond what is  mentioned above in the assessment and plan.  Meds ordered this encounter  Medications  . meloxicam (MOBIC) 15 MG tablet    Sig: Take 1 tablet (15 mg total) by mouth daily. If needed for pain    Dispense:  30 tablet    Refill:  2  . metFORMIN (GLUCOPHAGE-XR) 500 MG 24 hr tablet    Sig: Take 2 tablets (1,000 mg total) by mouth daily with breakfast.    Dispense:  120 tablet    Refill:  3    Stop any other metformin-containing prescriptions  . sitaGLIPtin (JANUVIA) 100 MG tablet    Sig: Take 1 tablet (100 mg total) by mouth daily.    Dispense:  30 tablet    Refill:  3  . pravastatin (PRAVACHOL) 20 MG tablet    Sig: Take 1 tablet (20 mg total) by mouth at bedtime.    Dispense:  30 tablet    Refill:  0    DISCONTINUE PREVIOUS RX PRAVASTATIN 10MG   . Insulin Glargine (LANTUS SOLOSTAR) 100 UNIT/ML Solostar Pen    Sig: Inject 30 Units into the skin at bedtime.    Dispense:  5 pen    Refill:  5    30 day supply  . ACCU-CHEK FASTCLIX LANCETS MISC    Sig: 1 Device by Does not apply route 3 (three) times daily.    Dispense:  100 each    Refill:  5  . Blood Gluc Meter Disp-Strips (BLOOD GLUCOSE METER DISPOSABLE) DEVI    Sig: 1 each by Does not apply route 3 (three) times daily before meals.    Dispense:  100 each    Refill:  3   Medications Discontinued During This Encounter  Medication Reason  . ibuprofen (ADVIL,MOTRIN) 800 MG tablet Error  . naproxen (NAPROSYN) 500 MG tablet Error  . Omeprazole 20 MG TBEC Error  . ondansetron (ZOFRAN ODT) 4 MG disintegrating tablet Error  . predniSONE (STERAPRED UNI-PAK 21 TAB) 10 MG (21) TBPK  tablet Error  . meloxicam (MOBIC) 15 MG tablet Reorder  . methocarbamol (ROBAXIN-750) 750 MG tablet Prescription never filled  . cyclobenzaprine (FLEXERIL) 5 MG tablet Completed Course  . metFORMIN (GLUCOPHAGE) 1000 MG tablet Entry Error  . SitaGLIPtin-MetFORMIN HCl (JANUMET XR) 50-1000 MG TB24 Discontinued by provider  . pravastatin (PRAVACHOL) 20 MG tablet Reorder  . Insulin Glargine (LANTUS SOLOSTAR) 100 UNIT/ML Solostar Pen Reorder  . ACCU-CHEK FASTCLIX LANCETS MISC Reorder    Orders Placed This Encounter  Procedures  . Ambulatory referral to Gastroenterology  . Ambulatory referral to Orthopedic Surgery  . Ambulatory referral to Physical Therapy

## 2015-12-28 NOTE — Patient Instructions (Addendum)
Stop all of your diabetes medicines except for insulin Start two new diabetes medicines (at your pharmacy) Okay to start back on meloxicam as long as you don't take any ibuprofen or Advil or Goody powders We'll have you see the physical therapist about your back We'll have you see the orthopaedist about your shoulder and knee Return in 1-2 weeks for diabetes follow-up We'll have you see the gastroenterologist about your stooling complaints and for a colonoscopy

## 2015-12-28 NOTE — Telephone Encounter (Signed)
Anderson Malta from [employer] requesting a return call to discuss the meters and strips that where called in today.  [phone]

## 2016-01-08 ENCOUNTER — Ambulatory Visit: Payer: Medicaid Other | Admitting: Family Medicine

## 2016-01-10 ENCOUNTER — Ambulatory Visit: Payer: Medicaid Other | Admitting: Family Medicine

## 2016-01-17 ENCOUNTER — Ambulatory Visit: Payer: Medicaid Other | Attending: Family Medicine | Admitting: Physical Therapy

## 2016-01-18 ENCOUNTER — Telehealth: Payer: Self-pay | Admitting: Gastroenterology

## 2016-01-18 NOTE — Telephone Encounter (Signed)
Please call for colonoscopy screening

## 2016-01-19 ENCOUNTER — Encounter: Payer: Self-pay | Admitting: Family Medicine

## 2016-01-19 ENCOUNTER — Ambulatory Visit (INDEPENDENT_AMBULATORY_CARE_PROVIDER_SITE_OTHER): Payer: Medicaid Other | Admitting: Family Medicine

## 2016-01-19 VITALS — BP 118/72 | HR 85 | Temp 98.5°F | Resp 14 | Wt 208.0 lb

## 2016-01-19 DIAGNOSIS — Z720 Tobacco use: Secondary | ICD-10-CM

## 2016-01-19 DIAGNOSIS — M7661 Achilles tendinitis, right leg: Secondary | ICD-10-CM | POA: Diagnosis not present

## 2016-01-19 DIAGNOSIS — Z91013 Allergy to seafood: Secondary | ICD-10-CM | POA: Diagnosis not present

## 2016-01-19 DIAGNOSIS — H0520 Unspecified exophthalmos: Secondary | ICD-10-CM

## 2016-01-19 DIAGNOSIS — Z794 Long term (current) use of insulin: Secondary | ICD-10-CM | POA: Diagnosis not present

## 2016-01-19 DIAGNOSIS — E559 Vitamin D deficiency, unspecified: Secondary | ICD-10-CM

## 2016-01-19 DIAGNOSIS — R7989 Other specified abnormal findings of blood chemistry: Secondary | ICD-10-CM

## 2016-01-19 DIAGNOSIS — M7662 Achilles tendinitis, left leg: Secondary | ICD-10-CM | POA: Diagnosis not present

## 2016-01-19 DIAGNOSIS — Z1159 Encounter for screening for other viral diseases: Secondary | ICD-10-CM

## 2016-01-19 DIAGNOSIS — Z1239 Encounter for other screening for malignant neoplasm of breast: Secondary | ICD-10-CM | POA: Diagnosis not present

## 2016-01-19 DIAGNOSIS — N63 Unspecified lump in breast: Secondary | ICD-10-CM

## 2016-01-19 DIAGNOSIS — N6320 Unspecified lump in the left breast, unspecified quadrant: Secondary | ICD-10-CM

## 2016-01-19 DIAGNOSIS — E119 Type 2 diabetes mellitus without complications: Secondary | ICD-10-CM | POA: Diagnosis not present

## 2016-01-19 DIAGNOSIS — D473 Essential (hemorrhagic) thrombocythemia: Secondary | ICD-10-CM

## 2016-01-19 DIAGNOSIS — F17200 Nicotine dependence, unspecified, uncomplicated: Secondary | ICD-10-CM

## 2016-01-19 HISTORY — DX: Unspecified exophthalmos: H05.20

## 2016-01-19 HISTORY — DX: Other specified abnormal findings of blood chemistry: R79.89

## 2016-01-19 NOTE — Progress Notes (Signed)
BP 118/72 mmHg  Pulse 85  Temp(Src) 98.5 F (36.9 C) (Oral)  Resp 14  Wt 208 lb (94.348 kg)  SpO2 97%   Subjective:    Patient ID: Faith Smith, female    DOB: 16-Apr-1961, 55 y.o.   MRN: [id number]  HPI: Faith Smith is a 55 y.o. female  Chief Complaint  Patient presents with  . Follow-up    3 weeks  . Labs Only   Off and on pain in the back of the heel; right today; on mobic; has not tried ice yet; heating pad helped a little  Sugars have been doing well; all in teh 100s; no lows; she does think that additional teaching would be helpful  She is still smoking, 3 cigarettes; used 1/2 ppd; smokes when bored and to relieve stress and after eating  Platelets have been modestly high for over a year  Hx of vitamin D deficiency; has taken Rx in the past; not taking any right now  GERD has been doing okay; certain foods set her off, pizza, OJ, spaghetti sauce  Had eye exam this week; mild proptosis noted and CT scan of orbits recommended; not really anxious; losing weight; no thyroid trouble in the family  She says her last doctor found a lump in her breast, but patient never had the imaging studies; she can't feel anything there  At the end of our visit, she brought up back pain, but we had already run through our time, so she'll return for that  Depression screen [employer] 2/9 12/28/2015 04/14/2015 02/20/2015  Decreased Interest 0 1 3  Down, Depressed, Hopeless 0 0 3  PHQ - 2 Score 0 1 6  Altered sleeping - - 0  Tired, decreased energy - - 3  Change in appetite - - 0  Feeling bad or failure about yourself  - - 0  Trouble concentrating - - 3  Moving slowly or fidgety/restless - - 0  Suicidal thoughts - - 0  PHQ-9 Score - - 12  Difficult doing work/chores - - Very difficult   Relevant past medical, surgical, family and social history reviewed Past Medical History  Diagnosis Date  . Anxiety   . Arthritis   . Blood transfusion without reported diagnosis     previously    . Depression   . Diabetes mellitus without complication ([place])   . GERD (gastroesophageal reflux disease)   . Hyperlipidemia   . Elevated platelet count ([place]) 01/19/2016  . Proptosis 01/19/2016   Past Surgical History  Procedure Laterality Date  . Cholecystectomy    . Cesarean section      had 3   Family History  Problem Relation Age of Onset  . Cancer Mother   . Hypertension Mother   . Depression Brother    Social History  Substance Use Topics  . Smoking status: Current Every Day Smoker    Types: Cigarettes  . Smokeless tobacco: None     Comment: patient states she is working on it now  . Alcohol Use: No   Interim medical history since last visit reviewed. Allergies and medications reviewed  Review of Systems  Musculoskeletal:       Tenderness along both Achilles  Allergic/Immunologic: Positive for food allergies (shrimp, just GI upset, no anaphylaxis).   Per HPI unless specifically indicated above     Objective:    BP 118/72 mmHg  Pulse 85  Temp(Src) 98.5 F (36.9 C) (Oral)  Resp 14  Wt 208 lb (  94.348 kg)  SpO2 97%  Wt Readings from Last 3 Encounters:  01/19/16 208 lb (94.348 kg)  12/28/15 212 lb 6.4 oz (96.344 kg)  12/16/15 200 lb (90.719 kg)   body mass index is 32.08 kg/(m^2).  Physical Exam  Constitutional: She appears well-developed and well-nourished. No distress.  Weight down almost 4 pounds over the last 3 weeks  HENT:  Head: Normocephalic and atraumatic.  Eyes: EOM are normal. No scleral icterus.  Slight proptosis, no lid lag  Neck: No thyromegaly present.  Cardiovascular: Normal rate, regular rhythm and normal heart sounds.   No murmur heard. Pulmonary/Chest: Effort normal and breath sounds normal. No respiratory distress. She has no wheezes.  Abdominal: Soft. Bowel sounds are normal. She exhibits no distension.  Musculoskeletal: Normal range of motion. She exhibits no edema.       Right ankle: She exhibits normal range of motion and no  swelling. Tenderness (mild over Achilles tendon).       Left ankle: She exhibits normal range of motion and no swelling. Tenderness (mild over Achilles tendon).  Neurological: She is alert. She exhibits normal muscle tone.  Skin: Skin is warm and dry. She is not diaphoretic. No pallor.  Psychiatric: She has a normal mood and affect. Her behavior is normal. Judgment and thought content normal. Her mood appears not anxious. She does not exhibit a depressed mood.  Very pleasant   Diabetic Foot Form - Detailed   Diabetic Foot Exam - detailed  Diabetic Foot exam was performed with the following findings:  Yes   Visual Foot Exam completed.:  Yes  Are the toenails long?:  No  Are the toenails thick?:  No  Are the toenails ingrown?:  No    Pulse Foot Exam completed.:  Yes  Right Dorsalis Pedis:  Present Left Dorsalis Pedis:  Present  Sensory Foot Exam Completed.:  Yes  Swelling:  No  Semmes-Weinstein Monofilament Test  R Site 1-Great Toe:  Pos L Site 1-Great Toe:  Pos  R Site 4:  Pos L Site 4:  Pos  R Site 5:  Pos L Site 5:  Pos          Assessment & Plan:   Problem List Items Addressed This Visit      Endocrine   Diabetes mellitus type 2, controlled, without complications ([place]) - Primary    Has meter; checking once a day; foot exam by MD today; will refer for more teaching; limit sweets      Relevant Orders   Ambulatory referral to diabetic education   Hemoglobin A1c (Completed)   Lipid Panel w/o Chol/HDL Ratio (Completed)   Comprehensive metabolic panel (Completed)     Musculoskeletal and Integument   Achilles tendonitis, bilateral    Ice, meloxicam; let me know if not better and we'll refer to podiatry        Hematopoietic and Hemostatic   Elevated platelet count (HCC)   Relevant Orders   CBC with Differential/Platelet (Completed)     Other   RESOLVED: Breast cancer screening   Breast mass, left    Noted by previous doctor; pt has not had imaging; will get now        Relevant Orders   MM Digital Diagnostic Bilat   US BREAST COMPLETE UNI RIGHT INC AXILLA   US BREAST COMPLETE UNI LEFT INC AXILLA   Current smoker    Encouraged her to quit; see AVS      Need for hepatitis C screening test  Relevant Orders   Hepatitis C antibody (Completed)   Proptosis    Noted by eye doctor; reviewed his note; will get thyroid tests and CT orbits, consider Graves disease      Relevant Orders   TSH (Completed)   T4, free (Completed)   CT OrbitsS W/O CM   Vitamin D deficiency    Check level today      Relevant Orders   VITAMIN D 25 Hydroxy (Vit-D Deficiency, Fractures) (Completed)    Other Visit Diagnoses    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  An after-visit summary was printed and given to the patient at [place].  Please see the patient instructions which may contain other information and recommendations beyond what is mentioned above in the assessment and plan.    Orders Placed This Encounter  Procedures  . CT OrbitsS W/O CM  . MM Digital Diagnostic Bilat  . US BREAST COMPLETE UNI RIGHT INC AXILLA  . US BREAST COMPLETE UNI LEFT INC AXILLA  . Hemoglobin A1c  . Lipid Panel w/o Chol/HDL Ratio  . Hepatitis C antibody  . Comprehensive metabolic panel  . CBC with Differential/Platelet  . TSH  . T4, free  . VITAMIN D 25 Hydroxy (Vit-D Deficiency, Fractures)  . Food Allergy Profile  . Ambulatory referral to diabetic education

## 2016-01-19 NOTE — Assessment & Plan Note (Signed)
Ice, meloxicam; let me know if not better and we'll refer to podiatry

## 2016-01-19 NOTE — Patient Instructions (Addendum)
Please do see your eye doctor regularly, and have your eyes examined every year (or more often per his or her recommendation) -- glad that is up to date! Check your feet every night and let me know right away of any sores, infections, numbness, etc. Try to limit sweets, white bread, white rice, white potatoes It is okay with me for you to not check your fingerstick blood sugars (per [employer] of Endocrinology Best Practices), unless you are interested and feel it would be helpful for you  I do encourage you to quit smoking Call [phone] to sign up for smoking cessation classes You can call 1-800-QUIT-NOW to talk with a smoking cessation coach  I've referred you for some additional diabetes classes   Achilles Tendinitis Achilles tendinitis is inflammation of the tough, cord-like band that attaches the lower muscles of your leg to your heel (Achilles tendon). It is usually caused by overusing the tendon and joint involved.  CAUSES Achilles tendinitis can happen because of:  A sudden increase in exercise or activity (such as running).  Doing the same exercises or activities (such as jumping) over and over.  Not warming up calf muscles before exercising.  Exercising in shoes that are worn out or not made for exercise.  Having arthritis or a bone growth on the back of the heel bone. This can rub against the tendon and hurt the tendon. SIGNS AND SYMPTOMS The most common symptoms are:  Pain in the back of the leg, just above the heel. The pain usually gets worse with exercise and better with rest.  Stiffness or soreness in the back of the leg, especially in the morning.  Swelling of the skin over the Achilles tendon.  Trouble standing on tiptoe. Sometimes, an Achilles tendon tears (ruptures). Symptoms of an Achilles tendon rupture can include:  Sudden, severe pain in the back of the leg.  Trouble putting weight on the foot or walking normally. DIAGNOSIS Achilles  tendinitis will be diagnosed based on symptoms and a physical examination. An X-ray may be done to check if another condition is causing your symptoms. An MRI may be ordered if your health care provider suspects you may have completely torn your tendon, which is called an Achilles tendon rupture.  TREATMENT  Achilles tendinitis usually gets better over time. It can take weeks to months to heal completely. Treatment focuses on treating the symptoms and helping the injury heal. HOME CARE INSTRUCTIONS   Rest your Achilles tendon and avoid activities that cause pain.  Apply ice to the injured area:  Put ice in a plastic bag.  Place a towel between your skin and the bag.  Leave the ice on for 20 minutes, 2-3 times a day  Try to avoid using the tendon (other than gentle range of motion) while the tendon is painful. Do not resume use until instructed by your health care provider. Then begin use gradually. Do not increase use to the point of pain. If pain does develop, decrease use and continue the above measures. Gradually increase activities that do not cause discomfort until you achieve normal use.  Do exercises to make your calf muscles stronger and more flexible. Your health care provider or physical therapist can recommend exercises for you to do.  Wrap your ankle with an elastic bandage or other wrap. This can help keep your tendon from moving too much. Your health care provider will show you how to wrap your ankle correctly.  Only take over-the-counter or prescription medicines  for pain, discomfort, or fever as directed by your health care provider. SEEK MEDICAL CARE IF:   Your pain and swelling increase or pain is uncontrolled with medicines.  You develop new, unexplained symptoms or your symptoms get worse.  You are unable to move your toes or foot.  You develop warmth and swelling in your foot.  You have an unexplained temperature. MAKE SURE YOU:   Understand these  instructions.  Will watch your condition.  Will get help right away if you are not doing well or get worse.   This information is not intended to replace advice given to you by your health care provider. Make sure you discuss any questions you have with your health care provider.   Document Released: 06/05/2005 Document Revised: 09/16/2014 Document Reviewed: 04/07/2013 Elsevier Interactive Patient Education [employer].

## 2016-01-19 NOTE — Assessment & Plan Note (Signed)
Has meter; checking once a day; foot exam by MD today; will refer for more teaching; limit sweets

## 2016-01-19 NOTE — Assessment & Plan Note (Signed)
Noted by previous doctor; pt has not had imaging; will get now

## 2016-01-19 NOTE — Assessment & Plan Note (Signed)
Encouraged her to quit; see AVS

## 2016-01-19 NOTE — Assessment & Plan Note (Signed)
Check level today 

## 2016-01-20 NOTE — Assessment & Plan Note (Signed)
Refer to physical therapy; note from recent ER visit reviewed; no blood in urine

## 2016-01-20 NOTE — Assessment & Plan Note (Signed)
Will use meloxicam; stretching, physical therapy

## 2016-01-20 NOTE — Assessment & Plan Note (Signed)
Refer to GI 

## 2016-01-21 ENCOUNTER — Encounter: Payer: Self-pay | Admitting: Family Medicine

## 2016-01-21 NOTE — Assessment & Plan Note (Signed)
Noted by eye doctor; reviewed his note; will get thyroid tests and CT orbits, consider Graves disease

## 2016-01-24 LAB — FOOD ALLERGY PROFILE
Allergen Corn, IgE: <0.1 kU/L
MILK IGE: 0.1 kU/L — AB
Sesame Seed IgE: <0.1 kU/L
Shrimp IgE: 0.75 kU/L — AB
Soybean IgE: <0.1 kU/L
Walnut IgE: <0.1 kU/L
Wheat IgE: <0.1 kU/L

## 2016-01-24 LAB — HEPATITIS C ANTIBODY: Hep C Virus Ab: <0.1 s/co ratio (ref 0.0–0.9)

## 2016-01-24 LAB — COMPREHENSIVE METABOLIC PANEL
ALBUMIN: 4.4 g/dL (ref 3.5–5.5)
ALT: 15 IU/L (ref 0–32)
AST: 10 IU/L (ref 0–40)
Albumin/Globulin Ratio: 1.7 (ref 1.2–2.2)
Alkaline Phosphatase: 74 IU/L (ref 39–117)
BUN / CREAT RATIO: 14 (ref 9–23)
BUN: 11 mg/dL (ref 6–24)
Bilirubin Total: 0.2 mg/dL (ref 0.0–1.2)
CALCIUM: 9.7 mg/dL (ref 8.7–10.2)
CO2: 26 mmol/L (ref 18–29)
CREATININE: 0.81 mg/dL (ref 0.57–1.00)
Chloride: 101 mmol/L (ref 96–106)
GFR, EST AFRICAN AMERICAN: 94 mL/min/{1.73_m2} (ref >59)
GFR, EST NON AFRICAN AMERICAN: 81 mL/min/{1.73_m2} (ref >59)
GLOBULIN, TOTAL: 2.6 g/dL (ref 1.5–4.5)
Glucose: 95 mg/dL (ref 65–99)
Potassium: 3.9 mmol/L (ref 3.5–5.2)
SODIUM: 142 mmol/L (ref 134–144)
TOTAL PROTEIN: 7 g/dL (ref 6.0–8.5)

## 2016-01-24 LAB — CBC WITH DIFFERENTIAL/PLATELET
BASOS: 1 %
Basophils Absolute: 0.1 10*3/uL (ref 0.0–0.2)
EOS (ABSOLUTE): 0.2 10*3/uL (ref 0.0–0.4)
EOS: 4 %
HEMATOCRIT: 37.5 % (ref 34.0–46.6)
HEMOGLOBIN: 12.6 g/dL (ref 11.1–15.9)
Immature Grans (Abs): 0.1 10*3/uL (ref 0.0–0.1)
Immature Granulocytes: 1 %
LYMPHS ABS: 2.2 10*3/uL (ref 0.7–3.1)
Lymphs: 35 %
MCH: 27.5 pg (ref 26.6–33.0)
MCHC: 33.6 g/dL (ref 31.5–35.7)
MCV: 82 fL (ref 79–97)
MONOCYTES: 5 %
MONOS ABS: 0.3 10*3/uL (ref 0.1–0.9)
Neutrophils Absolute: 3.4 10*3/uL (ref 1.4–7.0)
Neutrophils: 54 %
Platelets: 382 10*3/uL — ABNORMAL HIGH (ref 150–379)
RBC: 4.59 x10E6/uL (ref 3.77–5.28)
RDW: 13.8 % (ref 12.3–15.4)
WBC: 6.2 10*3/uL (ref 3.4–10.8)

## 2016-01-24 LAB — HEMOGLOBIN A1C
ESTIMATED AVERAGE GLUCOSE: 166 mg/dL
HEMOGLOBIN A1C: 7.4 % — AB (ref 4.8–5.6)

## 2016-01-24 LAB — LIPID PANEL W/O CHOL/HDL RATIO
CHOLESTEROL TOTAL: 204 mg/dL — AB (ref 100–199)
HDL: 26 mg/dL — ABNORMAL LOW (ref >39)
LDL Calculated: 132 mg/dL — ABNORMAL HIGH (ref 0–99)
Triglycerides: 232 mg/dL — ABNORMAL HIGH (ref 0–149)
VLDL CHOLESTEROL CAL: 46 mg/dL — AB (ref 5–40)

## 2016-01-24 LAB — TSH: TSH: 1.38 u[IU]/mL (ref 0.450–4.500)

## 2016-01-24 LAB — T4, FREE: FREE T4: 1.24 ng/dL (ref 0.82–1.77)

## 2016-01-24 LAB — VITAMIN D 25 HYDROXY (VIT D DEFICIENCY, FRACTURES): Vit D, 25-Hydroxy: 24.1 ng/mL — ABNORMAL LOW (ref 30.0–100.0)

## 2016-01-31 ENCOUNTER — Telehealth: Payer: Self-pay | Admitting: Family Medicine

## 2016-01-31 DIAGNOSIS — E119 Type 2 diabetes mellitus without complications: Secondary | ICD-10-CM

## 2016-01-31 DIAGNOSIS — E782 Mixed hyperlipidemia: Secondary | ICD-10-CM

## 2016-01-31 DIAGNOSIS — Z794 Long term (current) use of insulin: Principal | ICD-10-CM

## 2016-01-31 NOTE — Telephone Encounter (Signed)
I tried to call pt about labs; all circuits busy

## 2016-02-01 ENCOUNTER — Ambulatory Visit: Payer: Medicaid Other | Admitting: Family Medicine

## 2016-02-05 NOTE — Telephone Encounter (Signed)
I called first number, unavailable, "all circuits are busy" message again; second number was a female voice, different name, I think it was a wrong number

## 2016-02-06 NOTE — Telephone Encounter (Signed)
Pt returning call

## 2016-02-09 ENCOUNTER — Ambulatory Visit: Payer: Medicaid Other | Admitting: Family Medicine

## 2016-02-09 NOTE — Telephone Encounter (Signed)
Can you please get patient to the phone so I can talk with her about her labs? I've had trouble with both numbers Thanks

## 2016-02-12 ENCOUNTER — Ambulatory Visit: Payer: Medicaid Other | Admitting: *Deleted

## 2016-02-12 NOTE — Telephone Encounter (Signed)
Left voice mail

## 2016-02-14 MED ORDER — METFORMIN HCL ER 500 MG PO TB24: 1000.0000 mg | ORAL_TABLET | Freq: Two times a day (BID) | ORAL | Status: DC

## 2016-02-14 MED ORDER — METFORMIN HCL ER 500 MG PO TB24: 1000.0000 mg | ORAL_TABLET | Freq: Every day | ORAL | Status: DC

## 2016-02-14 MED ORDER — INSULIN GLARGINE 100 UNIT/ML SOLOSTAR PEN: 33.0000 [IU] | PEN_INJECTOR | Freq: Every day | SUBCUTANEOUS | Status: DC

## 2016-02-14 MED ORDER — PRAVASTATIN SODIUM 20 MG PO TABS: 20.0000 mg | ORAL_TABLET | Freq: Every day | ORAL | Status: DC

## 2016-02-14 NOTE — Telephone Encounter (Signed)
I finally got to speak to patient Explained milk allergy and shrimp allergy; avoid food cooked in fryers that cook shrimp (don't eat hush puppies or french fries cooked in same oil as shrimp, e.g.) Cholesterol is not controlled; she admits she's been off of statin; start back She's not taking metformin regularly; upsets her stomach; will increase the insulin then by 3 units; call me Mon or Tues with sugars Vit D is low; start 2,000 iu daily

## 2016-02-15 ENCOUNTER — Other Ambulatory Visit: Payer: Self-pay

## 2016-02-15 ENCOUNTER — Telehealth: Payer: Self-pay | Admitting: Family Medicine

## 2016-02-15 DIAGNOSIS — H0520 Unspecified exophthalmos: Secondary | ICD-10-CM

## 2016-02-15 NOTE — Telephone Encounter (Signed)
Pt.notified

## 2016-02-15 NOTE — Telephone Encounter (Signed)
Gastroenterology Pre-Procedure Review  Request Date: 04/09/16 Requesting Physician: Dr. Sanda Klein  PATIENT REVIEW QUESTIONS: The patient responded to the following health history questions as indicated:    1. Are you having any GI issues? no 2. Do you have a personal history of Polyps? no 3. Do you have a family history of Colon Cancer or Polyps? no 4. Diabetes Mellitus? yes (Type 2) 5. Joint replacements in the past 12 months?no 6. Major health problems in the past 3 months?no 7. Any artificial heart valves, MVP, or defibrillator?no    MEDICATIONS & ALLERGIES:    Patient reports the following regarding taking any anticoagulation/antiplatelet therapy:   Plavix, Coumadin, Eliquis, Xarelto, Lovenox, Pradaxa, Brilinta, or Effient? no Aspirin? no  Patient confirms/reports the following medications:  Current Outpatient Prescriptions  Medication Sig Dispense Refill  . ACCU-CHEK FASTCLIX LANCETS MISC 1 Device by Does not apply route 3 (three) times daily. 100 each 5  . Blood Gluc Meter Disp-Strips (BLOOD GLUCOSE METER DISPOSABLE) DEVI 1 each by Does not apply route 3 (three) times daily before meals. 100 each 3  . cholecalciferol (VITAMIN D) 1000 UNITS tablet Take 1,000 Units by mouth daily.    . citalopram (CELEXA) 20 MG tablet Take 1 tablet (20 mg total) by mouth daily. 30 tablet 0  . Insulin Glargine (LANTUS SOLOSTAR) 100 UNIT/ML Solostar Pen Inject 33 Units into the skin at bedtime. 5 pen 5  . meloxicam (MOBIC) 15 MG tablet Take 1 tablet (15 mg total) by mouth daily. If needed for pain 30 tablet 2  . metFORMIN (GLUCOPHAGE-XR) 500 MG 24 hr tablet Take 2 tablets (1,000 mg total) by mouth daily with breakfast. 60 tablet 3  . ondansetron (ZOFRAN) 4 MG tablet Take 1 tablet (4 mg total) by mouth every 8 (eight) hours as needed for nausea or vomiting. 30 tablet 2  . pravastatin (PRAVACHOL) 20 MG tablet Take 1 tablet (20 mg total) by mouth at bedtime. 30 tablet 1  . sitaGLIPtin (JANUVIA) 100 MG tablet  Take 1 tablet (100 mg total) by mouth daily. 30 tablet 3  . traZODone (DESYREL) 100 MG tablet Take 1 tablet (100 mg total) by mouth at bedtime. 14 tablet 0  . Vitamin D, Ergocalciferol, (DRISDOL) 50000 UNITS CAPS capsule Take 1 capsule (50,000 Units total) by mouth every 7 (seven) days. 12 capsule 0   No current facility-administered medications for this visit.    Patient confirms/reports the following allergies:  Allergies  Allergen Reactions  . Atorvastatin Other (See Comments)    Arthralgia  . Percocet [Oxycodone-Acetaminophen] Nausea And Vomiting    No orders of the defined types were placed in this encounter.    AUTHORIZATION INFORMATION Primary Insurance: 1D#: Group #:  Secondary Insurance: 1D#: Group #:  SCHEDULE INFORMATION: Date: 04/09/16 Time: Location: [place]

## 2016-02-15 NOTE — Telephone Encounter (Signed)
Pt scheduled for screening colonoscopy at [facility] on 04/09/16. Instructs/rx mailed. Please precert

## 2016-02-15 NOTE — Assessment & Plan Note (Signed)
CT scan denied by insurance; refer to ophtho

## 2016-02-15 NOTE — Telephone Encounter (Signed)
Please let pt know that we're really sorry, but her insurance company won't approve the CT scan of her eye We'll have to refer her to an ophthalmologist now who can examine her and probably get it approved This will be a different kind of eye doctor than the one she already saw Thank you

## 2016-02-22 ENCOUNTER — Ambulatory Visit: Payer: Medicaid Other | Admitting: Family Medicine

## 2016-03-26 ENCOUNTER — Encounter: Payer: Self-pay | Admitting: Emergency Medicine

## 2016-03-26 ENCOUNTER — Emergency Department
Admission: EM | Admit: 2016-03-26 | Discharge: 2016-03-26 | Disposition: A | Discharge status: Home / Self Care | Payer: Medicaid Other | Attending: Emergency Medicine | Admitting: Emergency Medicine

## 2016-03-26 ENCOUNTER — Emergency Department: Payer: Medicaid Other

## 2016-03-26 DIAGNOSIS — R1011 Right upper quadrant pain: Secondary | ICD-10-CM | POA: Insufficient documentation

## 2016-03-26 DIAGNOSIS — R109 Unspecified abdominal pain: Secondary | ICD-10-CM

## 2016-03-26 DIAGNOSIS — F17210 Nicotine dependence, cigarettes, uncomplicated: Secondary | ICD-10-CM | POA: Diagnosis not present

## 2016-03-26 DIAGNOSIS — E785 Hyperlipidemia, unspecified: Secondary | ICD-10-CM | POA: Diagnosis not present

## 2016-03-26 DIAGNOSIS — Z7984 Long term (current) use of oral hypoglycemic drugs: Secondary | ICD-10-CM | POA: Insufficient documentation

## 2016-03-26 DIAGNOSIS — M1990 Unspecified osteoarthritis, unspecified site: Secondary | ICD-10-CM | POA: Insufficient documentation

## 2016-03-26 DIAGNOSIS — Z794 Long term (current) use of insulin: Secondary | ICD-10-CM | POA: Insufficient documentation

## 2016-03-26 DIAGNOSIS — E119 Type 2 diabetes mellitus without complications: Secondary | ICD-10-CM | POA: Diagnosis not present

## 2016-03-26 DIAGNOSIS — F329 Major depressive disorder, single episode, unspecified: Secondary | ICD-10-CM | POA: Insufficient documentation

## 2016-03-26 LAB — COMPREHENSIVE METABOLIC PANEL
ALT: 11 U/L — AB (ref 14–54)
AST: 15 U/L (ref 15–41)
Albumin: 4.1 g/dL (ref 3.5–5.0)
Alkaline Phosphatase: 68 U/L (ref 38–126)
Anion gap: 5 (ref 5–15)
BUN: 11 mg/dL (ref 6–20)
CHLORIDE: 105 mmol/L (ref 101–111)
CO2: 28 mmol/L (ref 22–32)
CREATININE: 0.73 mg/dL (ref 0.44–1.00)
Calcium: 9.3 mg/dL (ref 8.9–10.3)
GFR calc Af Amer: >60 mL/min (ref >60)
GFR calc non Af Amer: >60 mL/min (ref >60)
Glucose, Bld: 100 mg/dL — ABNORMAL HIGH (ref 65–99)
Potassium: 3.1 mmol/L — ABNORMAL LOW (ref 3.5–5.1)
SODIUM: 138 mmol/L (ref 135–145)
Total Bilirubin: 0.7 mg/dL (ref 0.3–1.2)
Total Protein: 7 g/dL (ref 6.5–8.1)

## 2016-03-26 LAB — TROPONIN I: Troponin I: <0.03 ng/mL (ref <0.03)

## 2016-03-26 LAB — CBC
HCT: 35 % (ref 35.0–47.0)
Hemoglobin: 12.7 g/dL (ref 12.0–16.0)
MCH: 29.1 pg (ref 26.0–34.0)
MCHC: 36.2 g/dL — ABNORMAL HIGH (ref 32.0–36.0)
MCV: 80.3 fL (ref 80.0–100.0)
PLATELETS: 363 10*3/uL (ref 150–440)
RBC: 4.35 MIL/uL (ref 3.80–5.20)
RDW: 13.8 % (ref 11.5–14.5)
WBC: 9.8 10*3/uL (ref 3.6–11.0)

## 2016-03-26 LAB — LIPASE, BLOOD: Lipase: 14 U/L (ref 11–51)

## 2016-03-26 MED ORDER — FAMOTIDINE 40 MG PO TABS: 40.0000 mg | ORAL_TABLET | Freq: Every evening | ORAL | Status: DC

## 2016-03-26 MED ORDER — SUCRALFATE 1 G PO TABS: 1.0000 g | ORAL_TABLET | Freq: Two times a day (BID) | ORAL | Status: DC

## 2016-03-26 MED ORDER — GI COCKTAIL ~~LOC~~
30.0000 mL | Freq: Once | ORAL | Status: AC
  Administered 2016-03-26: 30 mL via ORAL
  Filled 2016-03-26: qty 30

## 2016-03-26 NOTE — ED Notes (Signed)
Patient ambulatory to treatment room with slow and steady gait. Pt reports ate crab legs last night for dinner and woke up this morning with abdominal pain and nausea. Pt denies urinary symptoms, vomiting, or diarrhea. Pt alert and oriented x 4, no increased respirations noted, skin warm and dry.

## 2016-03-26 NOTE — Discharge Instructions (Signed)

## 2016-03-26 NOTE — ED Notes (Signed)
Pt reports that she is upper mid abd pain for the last hour - Pt reports she ate crab legs tonight and then her abd started hurting - Pt denies vomiting or diarrhea but reports she is nauseated

## 2016-03-26 NOTE — ED Provider Notes (Signed)
[facility] Provider Note   ____________________________________________  Time seen: Approximately 1:12 AM  I have reviewed the triage vital signs and the nursing notes.   HISTORY  Chief Complaint Abdominal Pain    HPI Faith [facility] is a 55 y.o. female who comes into the hospital today with some abdominal pain. The patient reports that she ate some crab legs around 11 PM and about an hour later started having some stomach pain. She reports that her stomach was hurting very badly. The patient did not take anything for pain but came straight in to the hospital. The patient has had some nausea with no vomiting and no diarrhea. The pain is in her upper abdomen and she rates it a 10 out of 10 in intensity. The patient reports that she has had this pain before. She reports that once a long time ago she had some shrimp and developed this pain. She did not come to the emergency department for followed up with her primary care physician. The patient reports she was told she had an allergy to shrimp but did not think she had an allergy to crab legs. The patient denies any chest pain denies any shortness of breath denies any fever or lightheadedness or dizziness. The patient is here to be evaluated.   Past Medical History  Diagnosis Date  . Anxiety   . Arthritis   . Blood transfusion without reported diagnosis     previously  . Depression   . Diabetes mellitus without complication ([place])   . GERD (gastroesophageal reflux disease)   . Hyperlipidemia   . Elevated platelet count ([place]) 01/19/2016  . Proptosis 01/19/2016    Patient Active Problem List   Diagnosis Date Noted  . Achilles tendonitis, bilateral 01/19/2016  . Elevated platelet count ([place]) 01/19/2016  . Proptosis 01/19/2016  . Need for hepatitis C screening test 01/19/2016  . Stool incontinence 12/28/2015  . Colon cancer screening 12/28/2015  . Right-sided back pain 12/28/2015  .  Osteoarthrosis multiple sites, not specified as generalized 04/14/2015  . Annual physical exam 04/14/2015  . Breast mass, left 04/14/2015  . Anxiety and depression 02/20/2015  . Diabetes mellitus type 2, controlled, without complications ([place]) [id number]  . GERD (gastroesophageal reflux disease) 02/20/2015  . Hypercholesterolemia with hypertriglyceridemia 02/20/2015  . Arthralgia of multiple joints 02/20/2015  . Extreme obesity ([place]) 02/20/2015  . Vitamin D deficiency 02/20/2015  . Nausea without vomiting 02/20/2015  . Insomnia 02/20/2015  . Diabetes ([place]) 03/15/2014  . Adiposity 03/15/2014  . Current smoker 03/15/2014  . Abdominal pain 12/15/2012    Past Surgical History  Procedure Laterality Date  . Cholecystectomy    . Cesarean section      had 3    Current Outpatient Rx  Name  Route  Sig  Dispense  Refill  . ACCU-CHEK FASTCLIX LANCETS MISC   Does not apply   1 Device by Does not apply route 3 (three) times daily.   100 each   5   . Blood Gluc Meter Disp-Strips (BLOOD GLUCOSE METER DISPOSABLE) DEVI   Does not apply   1 each by Does not apply route 3 (three) times daily before meals.   100 each   3   . cholecalciferol (VITAMIN D) 1000 UNITS tablet   Oral   Take 1,000 Units by mouth daily.         . citalopram (CELEXA) 20 MG tablet   Oral   Take 1 tablet (20 mg total)  by mouth daily.   30 tablet   0   . famotidine (PEPCID) 40 MG tablet   Oral   Take 1 tablet (40 mg total) by mouth every evening.   20 tablet   0   . Insulin Glargine (LANTUS SOLOSTAR) 100 UNIT/ML Solostar Pen   Subcutaneous   Inject 33 Units into the skin at bedtime.   5 pen   5     New instructions   . meloxicam (MOBIC) 15 MG tablet   Oral   Take 1 tablet (15 mg total) by mouth daily. If needed for pain   30 tablet   2   . metFORMIN (GLUCOPHAGE-XR) 500 MG 24 hr tablet   Oral   Take 2 tablets (1,000 mg total) by mouth daily with breakfast.   60 tablet   3     Stop any other  metformin-containing prescriptions; ...   . ondansetron (ZOFRAN) 4 MG tablet   Oral   Take 1 tablet (4 mg total) by mouth every 8 (eight) hours as needed for nausea or vomiting.   30 tablet   2   . pravastatin (PRAVACHOL) 20 MG tablet   Oral   Take 1 tablet (20 mg total) by mouth at bedtime.   30 tablet   1     DISCONTINUE PREVIOUS RX PRAVASTATIN 10MG    . sitaGLIPtin (JANUVIA) 100 MG tablet   Oral   Take 1 tablet (100 mg total) by mouth daily.   30 tablet   3   . sucralfate (CARAFATE) 1 g tablet   Oral   Take 1 tablet (1 g total) by mouth 2 (two) times daily.   20 tablet   0   . traZODone (DESYREL) 100 MG tablet   Oral   Take 1 tablet (100 mg total) by mouth at bedtime.   14 tablet   0   . Vitamin D, Ergocalciferol, (DRISDOL) 50000 UNITS CAPS capsule   Oral   Take 1 capsule (50,000 Units total) by mouth every 7 (seven) days.   12 capsule   0     Allergies Atorvastatin and Percocet  Family History  Problem Relation Age of Onset  . Cancer Mother   . Hypertension Mother   . Depression Brother     Social History Social History  Substance Use Topics  . Smoking status: Current Every Day Smoker -- 0.50 packs/day    Types: Cigarettes  . Smokeless tobacco: None     Comment: patient states she is working on it now  . Alcohol Use: No    Review of Systems Constitutional: No fever/chills Eyes: No visual changes. ENT: No sore throat. Cardiovascular: Denies chest pain. Respiratory: Denies shortness of breath. Gastrointestinal: abdominal pain.   nausea, no vomiting.  No diarrhea.  No constipation. Genitourinary: Negative for dysuria. Musculoskeletal: Negative for back pain. Skin: Negative for rash. Neurological: Negative for headaches, focal weakness or numbness.  10-point ROS otherwise negative.  ____________________________________________   PHYSICAL EXAM:  VITAL SIGNS: ED Triage Vitals  Enc Vitals Group     BP 03/26/16 0053 146/63 mmHg     Pulse  Rate 03/26/16 0053 71     Resp 03/26/16 0053 18     Temp 03/26/16 0053 98.1 F (36.7 C)     Temp Source 03/26/16 0053 Oral     SpO2 03/26/16 0053 96 %     Weight 03/26/16 0053 210 lb (95.255 kg)     Height 03/26/16 0053 5\' 7"  (1.702 m)  Head Cir --      Peak Flow --      Pain Score 03/26/16 0055 10     Pain Loc --      Pain Edu? --      Excl. in [place]? --     Constitutional: Alert and oriented. Well appearing and in Moderate distress. Eyes: Conjunctivae are normal. PERRL. EOMI. Head: Atraumatic. Nose: No congestion/rhinnorhea. Mouth/Throat: Mucous membranes are moist.  Oropharynx non-erythematous. Cardiovascular: Normal rate, regular rhythm. Grossly normal heart sounds.  Good peripheral circulation. Respiratory: Normal respiratory effort.  No retractions. Lungs CTAB. Gastrointestinal: Soft with epigastric tenderness to palpation. No distention. Positive bowel sounds Musculoskeletal: No lower extremity tenderness nor edema.   Neurologic:  Normal speech and language.  Skin:  Skin is warm, dry and intact. Psychiatric: Mood and affect are normal.   ____________________________________________   LABS (all labs ordered are listed, but only abnormal results are displayed)  Labs Reviewed  CBC - Abnormal; Notable for the following:    MCHC 36.2 (*)    All other components within normal limits  COMPREHENSIVE METABOLIC PANEL - Abnormal; Notable for the following:    Potassium 3.1 (*)    Glucose, Bld 100 (*)    ALT 11 (*)    All other components within normal limits  LIPASE, BLOOD  TROPONIN I   ____________________________________________  EKG  ED ECG REPORT I, Loney Hering, the attending physician, personally viewed and interpreted this ECG.   Date: 03/26/2016  EKG Time: 212  Rate: 63  Rhythm: normal sinus rhythm  Axis: normal  Intervals:none  ST&T Change: none  ____________________________________________  RADIOLOGY  [place] abd: Status post cholecystectomy,  no cause for pain identified ____________________________________________   PROCEDURES  Procedure(s) performed: None  Procedures  Critical Care performed: No  ____________________________________________   INITIAL IMPRESSION / ASSESSMENT AND PLAN / ED COURSE  Pertinent labs & imaging results that were available during my care of the patient were reviewed by me and considered in my medical decision making (see chart for details).  This is a 55 year old female who comes into the hospital today with some abdominal pain. The patient has this pain after eating some crab legs. I will check some blood work give the patient a GI cocktail. Although the patient has had her gallbladder removed I will also do an ultrasound to evaluate for possible stone in her bile duct. The patient will be reassessed and reevaluated.  After the GI cocktail the patient reports that the pain is much improved. She is sitting up and in no acute distress. The patient's blood work is also unremarkable. I will discharge the patient to home and have her follow-up with her primary care physician. The patient has no further questions or concerns at this time. ____________________________________________   FINAL CLINICAL IMPRESSION(S) / ED DIAGNOSES  Final diagnoses:  Abdominal pain      NEW MEDICATIONS STARTED DURING THIS VISIT:  New Prescriptions   FAMOTIDINE (PEPCID) 40 MG TABLET    Take 1 tablet (40 mg total) by mouth every evening.   SUCRALFATE (CARAFATE) 1 G TABLET    Take 1 tablet (1 g total) by mouth 2 (two) times daily.     Note:  This document was prepared using Dragon voice recognition software and may include unintentional dictation errors.    Loney Hering, MD 03/26/16 [phone]

## 2016-04-01 ENCOUNTER — Ambulatory Visit: Payer: Medicaid Other | Admitting: *Deleted

## 2016-04-08 ENCOUNTER — Encounter: Payer: Self-pay | Admitting: *Deleted

## 2016-04-09 ENCOUNTER — Encounter: Admission: RE | Payer: Self-pay | Source: Ambulatory Visit

## 2016-04-09 ENCOUNTER — Telehealth: Payer: Self-pay | Admitting: Emergency Medicine

## 2016-04-09 ENCOUNTER — Ambulatory Visit: Admission: RE | Admit: 2016-04-09 | Payer: Medicaid Other | Source: Ambulatory Visit | Admitting: Gastroenterology

## 2016-04-09 SURGERY — COLONOSCOPY WITH PROPOFOL
Anesthesia: General

## 2016-04-09 NOTE — Telephone Encounter (Signed)
Patient called and would like to know if she can get a note for air condition to unit to be placed in home due to medical condition.  Will pick up if this can be done.

## 2016-04-10 NOTE — Telephone Encounter (Signed)
Patient came by the office; needs air conditioning; too hot in house; note written, given to her

## 2016-04-22 ENCOUNTER — Ambulatory Visit: Payer: Medicaid Other | Admitting: Family Medicine

## 2016-04-23 ENCOUNTER — Ambulatory Visit: Payer: Medicaid Other | Attending: Family Medicine

## 2016-04-23 ENCOUNTER — Other Ambulatory Visit: Payer: Medicaid Other

## 2016-05-10 ENCOUNTER — Other Ambulatory Visit: Payer: Medicaid Other

## 2016-05-10 ENCOUNTER — Ambulatory Visit: Admission: RE | Admit: 2016-05-10 | Payer: Medicaid Other | Source: Ambulatory Visit

## 2016-05-24 ENCOUNTER — Ambulatory Visit
Admission: RE | Admit: 2016-05-24 | Discharge: 2016-05-24 | Disposition: A | Discharge status: Home / Self Care | Payer: Medicaid Other | Source: Ambulatory Visit | Attending: Family Medicine | Admitting: Family Medicine

## 2016-05-24 ENCOUNTER — Ambulatory Visit: Admission: RE | Admit: 2016-05-24 | Payer: Medicaid Other | Source: Ambulatory Visit

## 2016-05-24 DIAGNOSIS — N63 Unspecified lump in breast: Secondary | ICD-10-CM | POA: Diagnosis not present

## 2016-05-27 ENCOUNTER — Ambulatory Visit: Payer: Medicaid Other | Admitting: Family Medicine

## 2016-06-13 ENCOUNTER — Ambulatory Visit: Payer: Medicaid Other | Admitting: Family Medicine

## 2016-07-26 ENCOUNTER — Emergency Department: Payer: Medicaid Other

## 2016-07-26 ENCOUNTER — Encounter: Payer: Self-pay | Admitting: *Deleted

## 2016-07-26 ENCOUNTER — Emergency Department
Admission: EM | Admit: 2016-07-26 | Discharge: 2016-07-26 | Disposition: A | Discharge status: Home / Self Care | Payer: Medicaid Other | Attending: Student in an Organized Health Care Education/Training Program | Admitting: Student in an Organized Health Care Education/Training Program

## 2016-07-26 DIAGNOSIS — R059 Cough, unspecified: Secondary | ICD-10-CM

## 2016-07-26 DIAGNOSIS — R05 Cough: Secondary | ICD-10-CM | POA: Diagnosis present

## 2016-07-26 DIAGNOSIS — Z794 Long term (current) use of insulin: Secondary | ICD-10-CM | POA: Diagnosis not present

## 2016-07-26 DIAGNOSIS — Z791 Long term (current) use of non-steroidal anti-inflammatories (NSAID): Secondary | ICD-10-CM | POA: Diagnosis not present

## 2016-07-26 DIAGNOSIS — E119 Type 2 diabetes mellitus without complications: Secondary | ICD-10-CM | POA: Insufficient documentation

## 2016-07-26 DIAGNOSIS — F17210 Nicotine dependence, cigarettes, uncomplicated: Secondary | ICD-10-CM | POA: Diagnosis not present

## 2016-07-26 DIAGNOSIS — B373 Candidiasis of vulva and vagina: Secondary | ICD-10-CM | POA: Insufficient documentation

## 2016-07-26 DIAGNOSIS — J189 Pneumonia, unspecified organism: Secondary | ICD-10-CM | POA: Diagnosis not present

## 2016-07-26 DIAGNOSIS — Z79899 Other long term (current) drug therapy: Secondary | ICD-10-CM | POA: Insufficient documentation

## 2016-07-26 DIAGNOSIS — B3731 Acute candidiasis of vulva and vagina: Secondary | ICD-10-CM

## 2016-07-26 LAB — COMPREHENSIVE METABOLIC PANEL
ALBUMIN: 4.4 g/dL (ref 3.5–5.0)
ALK PHOS: 91 U/L (ref 38–126)
ALT: 13 U/L — ABNORMAL LOW (ref 14–54)
AST: 19 U/L (ref 15–41)
Anion gap: 9 (ref 5–15)
BILIRUBIN TOTAL: 0.5 mg/dL (ref 0.3–1.2)
BUN: 7 mg/dL (ref 6–20)
CALCIUM: 9.6 mg/dL (ref 8.9–10.3)
CO2: 26 mmol/L (ref 22–32)
Chloride: 100 mmol/L — ABNORMAL LOW (ref 101–111)
Creatinine, Ser: 0.7 mg/dL (ref 0.44–1.00)
GFR calc non Af Amer: >60 mL/min (ref >60)
GLUCOSE: 339 mg/dL — AB (ref 65–99)
POTASSIUM: 3 mmol/L — AB (ref 3.5–5.1)
SODIUM: 135 mmol/L (ref 135–145)
TOTAL PROTEIN: 7.8 g/dL (ref 6.5–8.1)

## 2016-07-26 LAB — BLOOD GAS, VENOUS
ACID-BASE EXCESS: 5.1 mmol/L — AB (ref 0.0–2.0)
BICARBONATE: 27.8 mmol/L (ref 20.0–28.0)
FIO2: 0.21
O2 SAT: 98.2 %
PCO2 VEN: 34 mmHg — AB (ref 44.0–60.0)
PH VEN: 7.52 — AB (ref 7.250–7.430)
PO2 VEN: 97 mmHg — AB (ref 32.0–45.0)
Patient temperature: 37

## 2016-07-26 LAB — CBC WITH DIFFERENTIAL/PLATELET
BASOS PCT: 2 %
Basophils Absolute: 0.2 10*3/uL — ABNORMAL HIGH (ref 0–0.1)
EOS ABS: 0.4 10*3/uL (ref 0–0.7)
Eosinophils Relative: 3 %
HCT: 40.1 % (ref 35.0–47.0)
Hemoglobin: 14 g/dL (ref 12.0–16.0)
LYMPHS ABS: 3.9 10*3/uL — AB (ref 1.0–3.6)
Lymphocytes Relative: 30 %
MCH: 28.3 pg (ref 26.0–34.0)
MCHC: 34.9 g/dL (ref 32.0–36.0)
MCV: 81.1 fL (ref 80.0–100.0)
MONO ABS: 0.5 10*3/uL (ref 0.2–0.9)
MONOS PCT: 4 %
Neutro Abs: 8 10*3/uL — ABNORMAL HIGH (ref 1.4–6.5)
Neutrophils Relative %: 61 %
Platelets: 424 10*3/uL (ref 150–440)
RBC: 4.94 MIL/uL (ref 3.80–5.20)
RDW: 13.6 % (ref 11.5–14.5)
WBC: 13 10*3/uL — ABNORMAL HIGH (ref 3.6–11.0)

## 2016-07-26 LAB — URINALYSIS COMPLETE WITH MICROSCOPIC (ARMC ONLY)
BILIRUBIN URINE: NEGATIVE
HGB URINE DIPSTICK: NEGATIVE
LEUKOCYTES UA: NEGATIVE
NITRITE: NEGATIVE
PH: 5 (ref 5.0–8.0)
Protein, ur: NEGATIVE mg/dL
SPECIFIC GRAVITY, URINE: 1.011 (ref 1.005–1.030)

## 2016-07-26 LAB — GLUCOSE, CAPILLARY
GLUCOSE-CAPILLARY: 373 mg/dL — AB (ref 65–99)
Glucose-Capillary: 330 mg/dL — ABNORMAL HIGH (ref 65–99)

## 2016-07-26 LAB — INFLUENZA PANEL BY PCR (TYPE A & B)
Influenza A By PCR: NEGATIVE
Influenza B By PCR: NEGATIVE

## 2016-07-26 MED ORDER — AZITHROMYCIN 500 MG PO TABS
500.0000 mg | ORAL_TABLET | Freq: Once | ORAL | Status: AC
  Administered 2016-07-26: 500 mg via ORAL
  Filled 2016-07-26: qty 1

## 2016-07-26 MED ORDER — ALBUTEROL SULFATE (2.5 MG/3ML) 0.083% IN NEBU
5.0000 mg | INHALATION_SOLUTION | Freq: Once | RESPIRATORY_TRACT | Status: AC
  Administered 2016-07-26: 5 mg via RESPIRATORY_TRACT
  Filled 2016-07-26: qty 6

## 2016-07-26 MED ORDER — MICONAZOLE NITRATE 1200 & 2 MG & % VA KIT: 1.0000 | PACK | Freq: Once | VAGINAL | 0 refills | Status: AC

## 2016-07-26 MED ORDER — SODIUM CHLORIDE 0.9 % IV BOLUS (SEPSIS)
1000.0000 mL | Freq: Once | INTRAVENOUS | Status: AC
  Administered 2016-07-26: 1000 mL via INTRAVENOUS

## 2016-07-26 MED ORDER — ALBUTEROL SULFATE HFA 108 (90 BASE) MCG/ACT IN AERS: 2.0000 | INHALATION_SPRAY | Freq: Four times a day (QID) | RESPIRATORY_TRACT | 2 refills | Status: DC | PRN

## 2016-07-26 MED ORDER — SODIUM CHLORIDE 0.9 % IV BOLUS (SEPSIS): 1000.0000 mL | Freq: Once | INTRAVENOUS | Status: DC

## 2016-07-26 MED ORDER — KETOROLAC TROMETHAMINE 30 MG/ML IJ SOLN
15.0000 mg | Freq: Once | INTRAMUSCULAR | Status: AC
  Administered 2016-07-26: 15 mg via INTRAVENOUS
  Filled 2016-07-26: qty 1

## 2016-07-26 MED ORDER — INSULIN ASPART 100 UNIT/ML ~~LOC~~ SOLN
6.0000 [IU] | Freq: Once | SUBCUTANEOUS | Status: AC
  Administered 2016-07-26: 6 [IU] via INTRAVENOUS
  Filled 2016-07-26: qty 6

## 2016-07-26 MED ORDER — AZITHROMYCIN 250 MG PO TABS: ORAL_TABLET | ORAL | 0 refills | Status: AC

## 2016-07-26 NOTE — ED Triage Notes (Signed)
Pt reports her glucose is high.  Pt states she also has a cough.  Pt states she is taking insulin and pills for sugar.  Pt has n/v/d.  Pt alert.

## 2016-07-26 NOTE — ED Notes (Signed)
Patient ambulated in room with pulse ox, maintained 100% while walking.

## 2016-07-26 NOTE — ED Notes (Signed)
Pt. Going home with friend 

## 2016-07-26 NOTE — ED Notes (Signed)
BS 373

## 2016-07-26 NOTE — ED Notes (Signed)
Patient transported to X-ray 

## 2016-07-26 NOTE — ED Provider Notes (Signed)
[facility] Emergency Department Provider Note    First MD Initiated Contact with Patient 07/26/16 1950     (approximate)  I have reviewed the triage vital signs and the nursing notes.   HISTORY  Chief Complaint Hyperglycemia and Cough    HPI Marializ Gonzelez is a 55 y.o. female who presents with 1 week of worsening malaise with cough and congestion for nausea, diarrhea and increasing blood sugar levels despite insulin therapy. Has not been recently on any antibiotics. States that she does have pain with coughing. Also admits to dysuria without any flank pain. Patient is that she has a yeast infection. Denies any other associated complaints at this time.   Past Medical History:  Diagnosis Date  . Anxiety   . Arthritis   . Blood transfusion without reported diagnosis    previously  . Depression   . Diabetes mellitus without complication ([place])   . Elevated platelet count ([place]) 01/19/2016  . GERD (gastroesophageal reflux disease)   . Hyperlipidemia   . Proptosis 01/19/2016   Family History  Problem Relation Age of Onset  . Cancer Mother   . Hypertension Mother   . Cervical cancer Mother   . Depression Brother    Past Surgical History:  Procedure Laterality Date  . BREAST BIOPSY Left 2015?   left  . CESAREAN SECTION     had 3  . CHOLECYSTECTOMY     Patient Active Problem List   Diagnosis Date Noted  . Achilles tendonitis, bilateral 01/19/2016  . Elevated platelet count ([place]) 01/19/2016  . Proptosis 01/19/2016  . Need for hepatitis C screening test 01/19/2016  . Stool incontinence 12/28/2015  . Colon cancer screening 12/28/2015  . Right-sided back pain 12/28/2015  . Osteoarthrosis multiple sites, not specified as generalized 04/14/2015  . Annual physical exam 04/14/2015  . Breast mass, left 04/14/2015  . Anxiety and depression 02/20/2015  . Diabetes mellitus type 2, controlled, without complications ([place]) [id number]  . GERD  (gastroesophageal reflux disease) 02/20/2015  . Hypercholesterolemia with hypertriglyceridemia 02/20/2015  . Arthralgia of multiple joints 02/20/2015  . Extreme obesity ([place]) 02/20/2015  . Vitamin D deficiency 02/20/2015  . Nausea without vomiting 02/20/2015  . Insomnia 02/20/2015  . Diabetes ([place]) 03/15/2014  . Adiposity 03/15/2014  . Current smoker 03/15/2014  . Abdominal pain 12/15/2012      Prior to Admission medications   Medication Sig Start Date End Date Taking? Authorizing Provider  ACCU-CHEK FASTCLIX LANCETS MISC 1 Device by Does not apply route 3 (three) times daily. 12/28/15   Arnetha Courser, MD  Blood Gluc Meter Disp-Strips (BLOOD GLUCOSE METER DISPOSABLE) DEVI 1 each by Does not apply route 3 (three) times daily before meals. 12/28/15   Arnetha Courser, MD  cholecalciferol (VITAMIN D) 1000 UNITS tablet Take 1,000 Units by mouth daily.    Historical Provider, MD  citalopram (CELEXA) 20 MG tablet Take 1 tablet (20 mg total) by mouth daily. 09/28/15 09/27/16  Carrie Mew, MD  famotidine (PEPCID) 40 MG tablet Take 1 tablet (40 mg total) by mouth every evening. 03/26/16 03/26/17  Loney Hering, MD  Insulin Glargine (LANTUS SOLOSTAR) 100 UNIT/ML Solostar Pen Inject 33 Units into the skin at bedtime. 02/14/16   Arnetha Courser, MD  meloxicam (MOBIC) 15 MG tablet Take 1 tablet (15 mg total) by mouth daily. If needed for pain 12/28/15   Arnetha Courser, MD  metFORMIN (GLUCOPHAGE-XR) 500 MG 24 hr tablet Take 2 tablets (1,000 mg total) by  mouth daily with breakfast. 02/14/16   Arnetha Courser, MD  ondansetron (ZOFRAN) 4 MG tablet Take 1 tablet (4 mg total) by mouth every 8 (eight) hours as needed for nausea or vomiting. 02/20/15   Bobetta Lime, MD  pravastatin (PRAVACHOL) 20 MG tablet Take 1 tablet (20 mg total) by mouth at bedtime. 02/14/16   Arnetha Courser, MD  sitaGLIPtin (JANUVIA) 100 MG tablet Take 1 tablet (100 mg total) by mouth daily. 12/28/15   Arnetha Courser, MD  sucralfate  (CARAFATE) 1 g tablet Take 1 tablet (1 g total) by mouth 2 (two) times daily. 03/26/16   Loney Hering, MD  traZODone (DESYREL) 100 MG tablet Take 1 tablet (100 mg total) by mouth at bedtime. 09/28/15   Carrie Mew, MD  Vitamin D, Ergocalciferol, (DRISDOL) 50000 UNITS CAPS capsule Take 1 capsule (50,000 Units total) by mouth every 7 (seven) days. 02/20/15   Bobetta Lime, MD    Allergies Atorvastatin and Percocet [oxycodone-acetaminophen]    Social History Social History  Substance Use Topics  . Smoking status: Current Every Day Smoker    Packs/day: 0.50    Types: Cigarettes  . Smokeless tobacco: Never Used     Comment: patient states she is working on it now  . Alcohol use No    Review of Systems Patient denies headaches, rhinorrhea, blurry vision, numbness, shortness of breath, chest pain, edema, cough, abdominal pain, nausea, vomiting, diarrhea, dysuria, fevers, rashes or hallucinations unless otherwise stated above in HPI. ____________________________________________   PHYSICAL EXAM:  VITAL SIGNS: Vitals:   07/26/16 1920  BP: 132/71  Pulse: (!) 102  Resp: 18  Temp: 99.3 F (37.4 C)    Constitutional: Alert and oriented.  in no acute distress. Eyes: Conjunctivae are normal. PERRL. EOMI. Head: Atraumatic. Nose: No congestion/rhinnorhea. Mouth/Throat: Mucous membranes are moist.  Oropharynx non-erythematous. Neck: No stridor. Painless ROM. No cervical spine tenderness to palpation Hematological/Lymphatic/Immunilogical: No cervical lymphadenopathy. Cardiovascular: Normal rate, regular rhythm. Grossly normal heart sounds.  Good peripheral circulation. Respiratory: Normal respiratory effort.  No retractions. Lungs with coarse breath sounds throughout Gastrointestinal: Soft and nontender. No distention. No abdominal bruits. No CVA tenderness. Musculoskeletal: No lower extremity tenderness nor edema.  No joint effusions. Neurologic:  Normal speech and language. No  gross focal neurologic deficits are appreciated. No gait instability. Skin:  Skin is warm, dry and intact. No rash noted. Psychiatric: Mood and affect are normal. Speech and behavior are normal.  ____________________________________________   LABS (all labs ordered are listed, but only abnormal results are displayed)  Results for orders placed or performed during the hospital encounter of 07/26/16 (from the past 24 hour(s))  Glucose, capillary     Status: Abnormal   Collection Time: 07/26/16  7:23 PM  Result Value Ref Range   Glucose-Capillary 373 (H) 65 - 99 mg/dL   ____________________________________________  ____________________________________________  RADIOLOGY I personally reviewed all radiographic images ordered to evaluate for the above acute complaints and reviewed radiology reports and findings.  These findings were personally discussed with the patient.  Please see medical record for radiology report.  ____________________________________________   PROCEDURES  Procedure(s) performed: none Procedures    Critical Care performed: no ____________________________________________   INITIAL IMPRESSION / ASSESSMENT AND PLAN / ED COURSE  Pertinent labs & imaging results that were available during my care of the patient were reviewed by me and considered in my medical decision making (see chart for details).  DDX: flu, bronchitis, pna, dehydration, dka, uti, yeast  Inez Catalina  Cinnamon is a 55 y.o. who presents to the ED with Above complaints as well as low-grade fever and tachycardia but otherwise he mechanically stable. We'll provide IV fluids for resuscitation. Exam and history concerning for pneumonia versus bronchitis. Will also send for fluid given her GI symptoms as well. We will evaluate for any evidence of diabetic ketoacidosis given her hyperglycemia.  The patient will be placed on continuous pulse oximetry and telemetry for monitoring.  Laboratory evaluation will be  sent to evaluate for the above complaints.     Clinical Course as of Jul 27 4  Ludwig Clarks Jul 26, 2016  2139 Patient reassessed. Leukocytosis noted but no evidence of DKA. Giving IV fluids as well as Toradol to help with cough. Do not want to start on steroids due to hyperglycemia. Based on duration of symptoms worsening cough fever and leukocytosis we'll treat empirically for communicated acquired pneumonia. We'll Amer light patient on pulse oximeter to evaluate for any hypoxia.  [PR]  2223 Patient was able to ambulate without any hypoxia. She remains in no acute distress. She is able to give [place] a urine sample and it does not show evidence of uti. I will start azithromycin for CAP. as she is also complaining of a yeast infection will also start on monistat. Patient was able to tolerate PO and was able to ambulate with a steady gait.  Have discussed with the patient and available family all diagnostics and treatments performed thus far and all questions were answered to the best of my ability. The patient demonstrates understanding and agreement with plan.    [PR]    Clinical Course User Index [PR] Merlyn Lot, MD     ____________________________________________   FINAL CLINICAL IMPRESSION(S) / ED DIAGNOSES  Final diagnoses:  Cough  Community acquired pneumonia, unspecified laterality  Yeast infection involving the vagina and surrounding area      NEW MEDICATIONS STARTED DURING THIS VISIT:  New Prescriptions   No medications on file     Note:  This document was prepared using Dragon voice recognition software and may include unintentional dictation errors.    Merlyn Lot, MD 07/27/16 [phone]

## 2016-07-31 LAB — CULTURE, BLOOD (ROUTINE X 2): CULTURE: NO GROWTH

## 2016-09-04 ENCOUNTER — Other Ambulatory Visit: Payer: Self-pay

## 2016-09-04 MED ORDER — INSULIN PEN NEEDLE 31G X 5 MM MISC: 3 refills | Status: DC

## 2016-09-04 NOTE — Telephone Encounter (Signed)
Patient requesting refill of Ulticare Pen Needles to [facility].

## 2016-09-04 NOTE — Telephone Encounter (Signed)
rx sent

## 2017-02-26 ENCOUNTER — Encounter: Payer: Self-pay | Admitting: Family Medicine

## 2017-02-26 ENCOUNTER — Ambulatory Visit (INDEPENDENT_AMBULATORY_CARE_PROVIDER_SITE_OTHER): Payer: Medicaid Other | Admitting: Family Medicine

## 2017-02-26 VITALS — BP 136/64 | HR 93 | Temp 98.4°F | Resp 14 | Wt 219.2 lb

## 2017-02-26 DIAGNOSIS — E119 Type 2 diabetes mellitus without complications: Secondary | ICD-10-CM

## 2017-02-26 DIAGNOSIS — E559 Vitamin D deficiency, unspecified: Secondary | ICD-10-CM

## 2017-02-26 DIAGNOSIS — K219 Gastro-esophageal reflux disease without esophagitis: Secondary | ICD-10-CM | POA: Diagnosis not present

## 2017-02-26 DIAGNOSIS — G4700 Insomnia, unspecified: Secondary | ICD-10-CM | POA: Diagnosis not present

## 2017-02-26 DIAGNOSIS — F419 Anxiety disorder, unspecified: Secondary | ICD-10-CM | POA: Diagnosis not present

## 2017-02-26 DIAGNOSIS — R5383 Other fatigue: Secondary | ICD-10-CM | POA: Insufficient documentation

## 2017-02-26 DIAGNOSIS — F32A Depression, unspecified: Secondary | ICD-10-CM

## 2017-02-26 DIAGNOSIS — F329 Major depressive disorder, single episode, unspecified: Secondary | ICD-10-CM | POA: Diagnosis not present

## 2017-02-26 DIAGNOSIS — R7989 Other specified abnormal findings of blood chemistry: Secondary | ICD-10-CM

## 2017-02-26 DIAGNOSIS — Z1211 Encounter for screening for malignant neoplasm of colon: Secondary | ICD-10-CM | POA: Diagnosis not present

## 2017-02-26 DIAGNOSIS — R1013 Epigastric pain: Secondary | ICD-10-CM | POA: Diagnosis not present

## 2017-02-26 DIAGNOSIS — Z794 Long term (current) use of insulin: Secondary | ICD-10-CM

## 2017-02-26 DIAGNOSIS — Z5181 Encounter for therapeutic drug level monitoring: Secondary | ICD-10-CM | POA: Diagnosis not present

## 2017-02-26 LAB — COMPLETE METABOLIC PANEL WITH GFR
ALBUMIN: 4 g/dL (ref 3.6–5.1)
ALT: 12 U/L (ref 6–29)
AST: 12 U/L (ref 10–35)
Alkaline Phosphatase: 118 U/L (ref 33–130)
BUN: 11 mg/dL (ref 7–25)
CALCIUM: 9.8 mg/dL (ref 8.6–10.4)
CO2: 26 mmol/L (ref 20–31)
CREATININE: 0.88 mg/dL (ref 0.50–1.05)
Chloride: 100 mmol/L (ref 98–110)
GFR, Est African American: 84 mL/min (ref >=60)
GFR, Est Non African American: 73 mL/min (ref >=60)
GLUCOSE: 476 mg/dL — AB (ref 65–99)
POTASSIUM: 3.8 mmol/L (ref 3.5–5.3)
SODIUM: 136 mmol/L (ref 135–146)
Total Bilirubin: 0.3 mg/dL (ref 0.2–1.2)
Total Protein: 6.9 g/dL (ref 6.1–8.1)

## 2017-02-26 LAB — CBC WITH DIFFERENTIAL/PLATELET
BASOS PCT: 1 %
Basophils Absolute: 83 cells/uL (ref 0–200)
EOS ABS: 249 {cells}/uL (ref 15–500)
Eosinophils Relative: 3 %
HEMATOCRIT: 37.7 % (ref 35.0–45.0)
HEMOGLOBIN: 12.5 g/dL (ref 11.7–15.5)
LYMPHS ABS: 2739 {cells}/uL (ref 850–3900)
Lymphocytes Relative: 33 %
MCH: 27.2 pg (ref 27.0–33.0)
MCHC: 33.2 g/dL (ref 32.0–36.0)
MCV: 82.1 fL (ref 80.0–100.0)
MONO ABS: 249 {cells}/uL (ref 200–950)
MPV: 9.1 fL (ref 7.5–12.5)
Monocytes Relative: 3 %
Neutro Abs: 4980 cells/uL (ref 1500–7800)
Neutrophils Relative %: 60 %
Platelets: 378 10*3/uL (ref 140–400)
RBC: 4.59 MIL/uL (ref 3.80–5.10)
RDW: 14 % (ref 11.0–15.0)
WBC: 8.3 10*3/uL (ref 3.8–10.8)

## 2017-02-26 LAB — LIPID PANEL
Cholesterol: 232 mg/dL — ABNORMAL HIGH (ref <200)
HDL: 30 mg/dL — AB (ref >50)
Total CHOL/HDL Ratio: 7.7 Ratio — ABNORMAL HIGH (ref <5.0)
Triglycerides: 514 mg/dL — ABNORMAL HIGH (ref <150)

## 2017-02-26 MED ORDER — TRAZODONE HCL 100 MG PO TABS: 100.0000 mg | ORAL_TABLET | Freq: Every day | ORAL | 5 refills | Status: DC

## 2017-02-26 MED ORDER — OMEPRAZOLE 20 MG PO CPDR: 20.0000 mg | DELAYED_RELEASE_CAPSULE | Freq: Every day | ORAL | 3 refills | Status: DC

## 2017-02-26 NOTE — Assessment & Plan Note (Signed)
Doing much better

## 2017-02-26 NOTE — Assessment & Plan Note (Signed)
Trazodone Rx given

## 2017-02-26 NOTE — Assessment & Plan Note (Signed)
Check CBC 

## 2017-02-26 NOTE — Progress Notes (Signed)
BP 136/64   Pulse 93   Temp 98.4 F (36.9 C) (Oral)   Resp 14   Wt 219 lb 3.2 oz (99.4 kg)   LMP 04/14/2015   SpO2 99%   BMI 34.33 kg/m    Subjective:    Patient ID: Faith Smith, female    DOB: 10-05-1960, 56 y.o.   MRN: [id number]  HPI: Faith Smith is a 57 y.o. female  Chief Complaint  Patient presents with  . Medication Refill  . Abdominal Pain    epigastric   HPI Patient is here for medication refills  She also has epigastric pain On and off for a while Really got bad last night; eating made it worse last night Ham sandwich, cheese, nothing else fatty Can't have milk anyway Pain started while eating, squeezing and throbbing and aching and stabbing Did not go through to the back Felt sweaty last night Used to get this a lot, used to have to go to the hospital; wasn't as severe as previous episodes At the hospital in the past, they told her it was acid reflux; they would give her a pain shot They told her she had ulcers Not taking the pepcid at all but is willing to start back when she gets her check No meloxicam for a while She has been taking aleve instead Last night, took tylenol No blood in the stools, no dark stools Father had ulcers, bleeding Had acid reflux really bad 2 weeks ago Tried an old time remedy; apple cider vinegar and water, but sugar went low, so did not repeat  Type 2 diabetes; taking metformin and insulin "doing good" she says; sugar went low though with apple cider vinegar treatment (above)  Was going to [place] for mental health; she ran out of the citalopram and now feels a lot better; taking her hair out; not really having any anxiety or depression  Would like to go back on the trazodone for trouble sleeping; long-term   Depression screen [facility] 2/9 02/26/2017 12/28/2015 04/14/2015 02/20/2015  Decreased Interest 0 0 1 3  Down, Depressed, Hopeless 1 0 0 3  PHQ - 2 Score 1 0 1 6  Altered sleeping - - - 0  Tired, decreased energy - - - 3    Change in appetite - - - 0  Feeling bad or failure about yourself  - - - 0  Trouble concentrating - - - 3  Moving slowly or fidgety/restless - - - 0  Suicidal thoughts - - - 0  PHQ-9 Score - - - 12  Difficult doing work/chores - - - Very difficult    Relevant past medical, surgical, family and social history reviewed Past Medical History:  Diagnosis Date  . Anxiety   . Arthritis   . Blood transfusion without reported diagnosis    previously  . Depression   . Diabetes mellitus without complication ([place])   . Elevated platelet count 01/19/2016  . GERD (gastroesophageal reflux disease)   . Hyperlipidemia   . Proptosis 01/19/2016   Past Surgical History:  Procedure Laterality Date  . BREAST BIOPSY Left 2015?   left  . CESAREAN SECTION     had 3  . CHOLECYSTECTOMY     Family History  Problem Relation Age of Onset  . Cancer Mother   . Hypertension Mother   . Cervical cancer Mother   . Depression Brother    Social History   Social History  . Marital status: Married    Spouse name:  N/A  . Number of children: 7  . Years of education: N/A   Occupational History  . Not on file.   Social History Main Topics  . Smoking status: Current Every Day Smoker    Packs/day: 0.50    Types: Cigarettes  . Smokeless tobacco: Never Used     Comment: patient states she is working on it now  . Alcohol use No  . Drug use: No  . Sexual activity: Yes    Partners: Male   Other Topics Concern  . Not on file   Social History Narrative  . No narrative on file    Interim medical history since last visit reviewed. Allergies and medications reviewed  Review of Systems Per HPI unless specifically indicated above     Objective:    BP 136/64   Pulse 93   Temp 98.4 F (36.9 C) (Oral)   Resp 14   Wt 219 lb 3.2 oz (99.4 kg)   LMP 04/14/2015   SpO2 99%   BMI 34.33 kg/m   Wt Readings from Last 3 Encounters:  02/26/17 219 lb 3.2 oz (99.4 kg)  07/26/16 180 lb (81.6 kg)   03/26/16 210 lb (95.3 kg)    Physical Exam  Results for orders placed or performed during the hospital encounter of 07/26/16  Blood culture (routine x 2)  Result Value Ref Range   Specimen Description BLOOD LEFT HAND    Special Requests      BOTTLES DRAWN AEROBIC AND ANAEROBIC 12CCAERO,11CCANA   Culture NO GROWTH 5 DAYS    Report Status 07/31/2016 FINAL   Glucose, capillary  Result Value Ref Range   Glucose-Capillary 373 (H) 65 - 99 mg/dL  CBC with Differential/Platelet  Result Value Ref Range   WBC 13.0 (H) 3.6 - 11.0 K/uL   RBC 4.94 3.80 - 5.20 MIL/uL   Hemoglobin 14.0 12.0 - 16.0 g/dL   HCT 40.1 35.0 - 47.0 %   MCV 81.1 80.0 - 100.0 fL   MCH 28.3 26.0 - 34.0 pg   MCHC 34.9 32.0 - 36.0 g/dL   RDW 13.6 11.5 - 14.5 %   Platelets 424 150 - 440 K/uL   Neutrophils Relative % 61 %   Neutro Abs 8.0 (H) 1.4 - 6.5 K/uL   Lymphocytes Relative 30 %   Lymphs Abs 3.9 (H) 1.0 - 3.6 K/uL   Monocytes Relative 4 %   Monocytes Absolute 0.5 0.2 - 0.9 K/uL   Eosinophils Relative 3 %   Eosinophils Absolute 0.4 0 - 0.7 K/uL   Basophils Relative 2 %   Basophils Absolute 0.2 (H) 0 - 0.1 K/uL  Comprehensive metabolic panel  Result Value Ref Range   Sodium 135 135 - 145 mmol/L   Potassium 3.0 (L) 3.5 - 5.1 mmol/L   Chloride 100 (L) 101 - 111 mmol/L   CO2 26 22 - 32 mmol/L   Glucose, Bld 339 (H) 65 - 99 mg/dL   BUN 7 6 - 20 mg/dL   Creatinine, Ser 0.70 0.44 - 1.00 mg/dL   Calcium 9.6 8.9 - 10.3 mg/dL   Total Protein 7.8 6.5 - 8.1 g/dL   Albumin 4.4 3.5 - 5.0 g/dL   AST 19 15 - 41 U/L   ALT 13 (L) 14 - 54 U/L   Alkaline Phosphatase 91 38 - 126 U/L   Total Bilirubin 0.5 0.3 - 1.2 mg/dL   GFR calc non Af Amer >60 >60 mL/min   GFR calc Af Amer >60 >60  mL/min   Anion gap 9 5 - 15  Urinalysis complete, with microscopic ([facility] only)  Result Value Ref Range   Color, Urine YELLOW (A) YELLOW   APPearance CLEAR (A) CLEAR   Glucose, UA >500 (A) NEGATIVE mg/dL   Bilirubin Urine NEGATIVE  NEGATIVE   Ketones, ur TRACE (A) NEGATIVE mg/dL   Specific Gravity, Urine 1.011 1.005 - 1.030   Hgb urine dipstick NEGATIVE NEGATIVE   pH 5.0 5.0 - 8.0   Protein, ur NEGATIVE NEGATIVE mg/dL   Nitrite NEGATIVE NEGATIVE   Leukocytes, UA NEGATIVE NEGATIVE   RBC / HPF 0-5 0 - 5 RBC/hpf   WBC, UA 0-5 0 - 5 WBC/hpf   Bacteria, UA RARE (A) NONE SEEN   Squamous Epithelial / LPF 0-5 (A) NONE SEEN   Mucous PRESENT   Blood gas, venous  Result Value Ref Range   FIO2 0.21    Delivery systems ROOM AIR    pH, Ven 7.52 (H) 7.250 - 7.430   pCO2, Ven 34 (L) 44.0 - 60.0 mmHg   pO2, Ven 97.0 (H) 32.0 - 45.0 mmHg   Bicarbonate 27.8 20.0 - 28.0 mmol/L   Acid-Base Excess 5.1 (H) 0.0 - 2.0 mmol/L   O2 Saturation 98.2 %   Patient temperature 37.0    Collection site VEIN    Sample type VENOUS   Influenza panel by PCR (type A & B, H1N1)  Result Value Ref Range   Influenza A By PCR NEGATIVE NEGATIVE   Influenza B By PCR NEGATIVE NEGATIVE  Glucose, capillary  Result Value Ref Range   Glucose-Capillary 330 (H) 65 - 99 mg/dL      Assessment & Plan:   Problem List Items Addressed This Visit      Digestive   GERD (gastroesophageal reflux disease)    Start back on acid-reducer; will Rx omeprazle; avoid triggers; to ER if severe      Relevant Medications   omeprazole (PRILOSEC) 20 MG capsule   Other Relevant Orders   Helicobacter pylori antigen det, stool     Endocrine   Diabetes mellitus type 2, controlled, without complications ([place])    Foot exam today; check labs; wheat bread preferred over white      Relevant Orders   Microalbumin / creatinine urine ratio   Lipid panel   Hemoglobin A1c     Other   Vitamin D deficiency    Check level and replace if needed      Relevant Orders   VITAMIN D 25 Hydroxy (Vit-D Deficiency, Fractures)   Medication monitoring encounter    Check labs      Relevant Orders   COMPLETE METABOLIC PANEL WITH GFR   Insomnia    Trazodone Rx given       Fatigue    Check vit D, CBC, glucose      Elevated platelet count    Check CBC      Relevant Orders   CBC with Differential/Platelet   Anxiety and depression    Doing much better       Other Visit Diagnoses    Acute epigastric pain    -  Primary   Relevant Orders   COMPLETE METABOLIC PANEL WITH GFR   Helicobacter pylori antigen det, stool   Lipase   Amylase   CBC with Differential/Platelet   Screen for colon cancer       Relevant Orders   Ambulatory referral to Gastroenterology       Follow up plan: Return in  about 3 weeks (around 03/19/2017) for follow-up visit with Dr. Sanda Klein.  An after-visit summary was printed and given to the patient at [place].  Please see the patient instructions which may contain other information and recommendations beyond what is mentioned above in the assessment and plan.  Meds ordered this encounter  Medications  . omeprazole (PRILOSEC) 20 MG capsule    Sig: Take 1 capsule (20 mg total) by mouth daily.    Dispense:  30 capsule    Refill:  3  . traZODone (DESYREL) 100 MG tablet    Sig: Take 1 tablet (100 mg total) by mouth at bedtime.    Dispense:  30 tablet    Refill:  5    Orders Placed This Encounter  Procedures  . Microalbumin / creatinine urine ratio  . Lipid panel  . Hemoglobin A1c  . COMPLETE METABOLIC PANEL WITH GFR  . VITAMIN D 25 Hydroxy (Vit-D Deficiency, Fractures)  . Helicobacter pylori antigen det, stool  . Lipase  . Amylase  . CBC with Differential/Platelet  . Ambulatory referral to Gastroenterology

## 2017-02-26 NOTE — Assessment & Plan Note (Signed)
Foot exam today; check labs; wheat bread preferred over white

## 2017-02-26 NOTE — Assessment & Plan Note (Signed)
Start back on acid-reducer; will Rx omeprazle; avoid triggers; to ER if severe

## 2017-02-26 NOTE — Assessment & Plan Note (Signed)
Check vit D, CBC, glucose

## 2017-02-26 NOTE — Assessment & Plan Note (Signed)
Check labs 

## 2017-02-26 NOTE — Patient Instructions (Addendum)
Let's get labs today Avoid all non-steroidal anti-inflammatory medicines If you need something for aches or pains, try to use Tylenol (acetaminophen) instead of non-steroidals (which include Aleve, ibuprofen, Advil, Motrin, and naproxen); non-steroidals can cause long-term kidney damage Start omeprazole to reduce stomach acid Try to limit or avoid triggers like coffee, caffeinated beverages, onions, chocolate, spicy foods, peppermint, acid foods like pizza, spaghetti sauce, and orange juice Lose weight if you are overweight or obese Try elevating the head of your bed by placing a small wedge between your mattress and box springs to keep acid in the stomach at night instead of coming up into your esophagus If your symptoms get worse / severe, go to the emergency department I do encourage you to quit smoking Call [phone] to sign up for smoking cessation classes You can call 1-800-QUIT-NOW to talk with a smoking cessation coach  Steps to Quit Smoking Smoking tobacco can be bad for your health. It can also affect almost every organ in your body. Smoking puts you and people around you at risk for many serious long-lasting (chronic) diseases. Quitting smoking is hard, but it is one of the best things that you can do for your health. It is never too late to quit. What are the benefits of quitting smoking? When you quit smoking, you lower your risk for getting serious diseases and conditions. They can include:  Lung cancer or lung disease.  Heart disease.  Stroke.  Heart attack.  Not being able to have children (infertility).  Weak bones (osteoporosis) and broken bones (fractures).  If you have coughing, wheezing, and shortness of breath, those symptoms may get better when you quit. You may also get sick less often. If you are pregnant, quitting smoking can help to lower your chances of having a baby of low birth weight. What can I do to help me quit smoking? Talk with your doctor about  what can help you quit smoking. Some things you can do (strategies) include:  Quitting smoking totally, instead of slowly cutting back how much you smoke over a period of time.  Going to in-person counseling. You are more likely to quit if you go to many counseling sessions.  Using resources and support systems, such as: ? Database administrator with a Social worker. ? Phone quitlines. ? Careers information officer. ? Support groups or group counseling. ? Text messaging programs. ? Mobile phone apps or applications.  Taking medicines. Some of these medicines may have nicotine in them. If you are pregnant or breastfeeding, do not take any medicines to quit smoking unless your doctor says it is okay. Talk with your doctor about counseling or other things that can help you.  Talk with your doctor about using more than one strategy at the same time, such as taking medicines while you are also going to in-person counseling. This can help make quitting easier. What things can I do to make it easier to quit? Quitting smoking might feel very hard at first, but there is a lot that you can do to make it easier. Take these steps:  Talk to your family and friends. Ask them to support and encourage you.  Call phone quitlines, reach out to support groups, or work with a Social worker.  Ask people who smoke to not smoke around you.  Avoid places that make you want (trigger) to smoke, such as: ? Bars. ? Parties. ? Smoke-break areas at work.  Spend time with people who do not smoke.  Lower the stress in  your life. Stress can make you want to smoke. Try these things to help your stress: ? Getting regular exercise. ? Deep-breathing exercises. ? Yoga. ? Meditating. ? Doing a body scan. To do this, close your eyes, focus on one area of your body at a time from head to toe, and notice which parts of your body are tense. Try to relax the muscles in those areas.  Download or buy apps on your mobile phone or tablet  that can help you stick to your quit plan. There are many free apps, such as QuitGuide from the [employer] Office manager for Disease Control and Prevention). You can find more support from smokefree.gov and other websites.  This information is not intended to replace advice given to you by your health care provider. Make sure you discuss any questions you have with your health care provider. Document Released: 06/22/2009 Document Revised: 04/23/2016 Document Reviewed: 01/10/2015 Elsevier Interactive Patient Education  2018 [employer].

## 2017-02-26 NOTE — Assessment & Plan Note (Signed)
Check level and replace if needed 

## 2017-02-27 ENCOUNTER — Other Ambulatory Visit: Payer: Self-pay

## 2017-02-27 ENCOUNTER — Other Ambulatory Visit: Payer: Self-pay | Admitting: Family Medicine

## 2017-02-27 DIAGNOSIS — Z794 Long term (current) use of insulin: Principal | ICD-10-CM

## 2017-02-27 DIAGNOSIS — E119 Type 2 diabetes mellitus without complications: Secondary | ICD-10-CM

## 2017-02-27 LAB — AMYLASE: Amylase: 38 U/L (ref 21–101)

## 2017-02-27 LAB — MICROALBUMIN / CREATININE URINE RATIO
CREATININE, URINE: 46 mg/dL (ref 20–320)
MICROALB UR: 0.8 mg/dL
MICROALB/CREAT RATIO: 17 ug/mg{creat} (ref <30)

## 2017-02-27 LAB — HEMOGLOBIN A1C
Hgb A1c MFr Bld: 11.4 % — ABNORMAL HIGH (ref <5.7)
Mean Plasma Glucose: 280 mg/dL

## 2017-02-27 LAB — VITAMIN D 25 HYDROXY (VIT D DEFICIENCY, FRACTURES): Vit D, 25-Hydroxy: 12 ng/mL — ABNORMAL LOW (ref 30–100)

## 2017-02-27 LAB — LIPASE: Lipase: 15 U/L (ref 7–60)

## 2017-02-27 MED ORDER — ICOSAPENT ETHYL 1 G PO CAPS: 2.0000 | ORAL_CAPSULE | Freq: Two times a day (BID) | ORAL | 5 refills | Status: DC

## 2017-02-27 MED ORDER — INSULIN GLARGINE 100 UNIT/ML SOLOSTAR PEN: 30.0000 [IU] | PEN_INJECTOR | Freq: Every day | SUBCUTANEOUS | 5 refills | Status: DC

## 2017-02-27 MED ORDER — PITAVASTATIN CALCIUM 2 MG PO TABS: 1.0000 | ORAL_TABLET | Freq: Every day | ORAL | 2 refills | Status: DC

## 2017-02-27 MED ORDER — VITAMIN D (ERGOCALCIFEROL) 1.25 MG (50000 UNIT) PO CAPS: 50000.0000 [IU] | ORAL_CAPSULE | ORAL | 1 refills | Status: AC

## 2017-02-27 MED ORDER — METFORMIN HCL ER 500 MG PO TB24: ORAL_TABLET | ORAL | 0 refills | Status: DC

## 2017-02-27 NOTE — Telephone Encounter (Signed)
Patient also called states needs refill on dm meds not sure if you were waiting for labs?

## 2017-02-27 NOTE — Progress Notes (Signed)
Joint aches with lipitor; will try livalo rx vit D and vascepa sent too

## 2017-02-27 NOTE — Telephone Encounter (Signed)
Lab alert glucose of 476

## 2017-02-28 ENCOUNTER — Other Ambulatory Visit: Payer: Self-pay | Admitting: Family Medicine

## 2017-02-28 ENCOUNTER — Telehealth: Payer: Self-pay | Admitting: Family Medicine

## 2017-02-28 DIAGNOSIS — Z794 Long term (current) use of insulin: Principal | ICD-10-CM

## 2017-02-28 DIAGNOSIS — E119 Type 2 diabetes mellitus without complications: Secondary | ICD-10-CM

## 2017-02-28 NOTE — Telephone Encounter (Signed)
rx sent

## 2017-02-28 NOTE — Telephone Encounter (Signed)
errenous °

## 2017-03-04 ENCOUNTER — Other Ambulatory Visit: Payer: Self-pay

## 2017-03-04 DIAGNOSIS — Z1211 Encounter for screening for malignant neoplasm of colon: Secondary | ICD-10-CM

## 2017-03-17 ENCOUNTER — Telehealth: Payer: Self-pay | Admitting: Family Medicine

## 2017-03-17 NOTE — Telephone Encounter (Signed)
-----   Message from Dennard Schaumann, [place] sent at 03/03/2017  4:35 PM EDT ----- Aldona Bar with [facility] [employer] stated that this patient must try and fail 2 of the preferred drugs (Fenofibrate and Gemfibrozil) prior to being able to get Vascepa and Livalo even with her Triglycerides being greater than 500.  Please send in new rx.

## 2017-03-17 NOTE — Telephone Encounter (Signed)
I'm going to appeal that It is no longer recommended that a patient be on both a statin and a fibric acid derivative Because of her diabetes, she should be on a statin, which means she should not be on gemfibrozil or fenofibrate I'll be glad to call or write an appeal if you'll let me know what the next step is

## 2017-03-18 NOTE — Telephone Encounter (Signed)
Please feel free to contact them using the information below and they will tell you what they require regarding doing an appeal.  Pharmacy Prior Approval [facility]  [address] [place], [place] [id number]  P: [phone] F: [phone]   Thanks

## 2017-03-20 ENCOUNTER — Ambulatory Visit (INDEPENDENT_AMBULATORY_CARE_PROVIDER_SITE_OTHER): Payer: Medicaid Other | Admitting: Family Medicine

## 2017-03-20 ENCOUNTER — Encounter: Payer: Self-pay | Admitting: Family Medicine

## 2017-03-20 DIAGNOSIS — E66811 Obesity, class 1: Secondary | ICD-10-CM

## 2017-03-20 DIAGNOSIS — E559 Vitamin D deficiency, unspecified: Secondary | ICD-10-CM | POA: Diagnosis not present

## 2017-03-20 DIAGNOSIS — E1165 Type 2 diabetes mellitus with hyperglycemia: Secondary | ICD-10-CM | POA: Diagnosis not present

## 2017-03-20 DIAGNOSIS — Z794 Long term (current) use of insulin: Secondary | ICD-10-CM | POA: Diagnosis not present

## 2017-03-20 DIAGNOSIS — E669 Obesity, unspecified: Secondary | ICD-10-CM | POA: Diagnosis not present

## 2017-03-20 DIAGNOSIS — E782 Mixed hyperlipidemia: Secondary | ICD-10-CM | POA: Diagnosis not present

## 2017-03-20 DIAGNOSIS — E119 Type 2 diabetes mellitus without complications: Secondary | ICD-10-CM

## 2017-03-20 MED ORDER — GLUCOSE BLOOD VI STRP: ORAL_STRIP | 5 refills | Status: DC

## 2017-03-20 MED ORDER — ICOSAPENT ETHYL 1 G PO CAPS: 2.0000 | ORAL_CAPSULE | Freq: Two times a day (BID) | ORAL | 5 refills | Status: DC

## 2017-03-20 MED ORDER — METFORMIN HCL ER 500 MG PO TB24: 2000.0000 mg | ORAL_TABLET | Freq: Every day | ORAL | 1 refills | Status: DC

## 2017-03-20 MED ORDER — PITAVASTATIN CALCIUM 2 MG PO TABS: 1.0000 | ORAL_TABLET | Freq: Every day | ORAL | 2 refills | Status: DC

## 2017-03-20 MED ORDER — ACCU-CHEK FASTCLIX LANCETS MISC: 1.0000 | Freq: Three times a day (TID) | 5 refills | Status: DC

## 2017-03-20 NOTE — Patient Instructions (Addendum)
We'll fight for your cholesterol medicines and call you with changes if they need to be replaced with something else Check out the information at familydoctor.org entitled "Nutrition for Weight Loss: What You Need to Know about Fad Diets" Try to lose between 1-2 pounds per week by taking in fewer calories and burning off more calories You can succeed by limiting portions, limiting foods dense in calories and fat, becoming more active, and drinking 8 glasses of water a day (64 ounces) Don't skip meals, especially breakfast, as skipping meals may alter your metabolism Do not use over-the-counter weight loss pills or gimmicks that claim rapid weight loss A healthy BMI (or body mass index) is between 18.5 and 24.9 You can calculate your ideal BMI at the [employer] website ClubMonetize.fr  I do NOT recommend that you stay out in the heat, especially being a diabetic and on metformin; if you get dehydrated, you could get really sick  Check your fingerstick blood sugars and call me if fasting glucose still over 150

## 2017-03-20 NOTE — Progress Notes (Signed)
BP 122/76 (BP Location: Right Arm, Patient Position: Sitting, Cuff Size: Large)   Pulse (!) 102   Temp 97.9 F (36.6 C) (Oral)   Resp 18   Wt 214 lb 4.8 oz (97.2 kg)   LMP 04/14/2015   SpO2 99%   BMI 33.56 kg/m    Subjective:    Patient ID: Faith Smith, female    DOB: [birth year], 56 y.o.   MRN: [id number]  HPI: Faith Smith is a 56 y.o. female  Chief Complaint  Patient presents with  . Follow-up    3 week F/U, Vascepa and Livalo needed PA. Patient was not able to start medication yet.    HPI She is here for f/u She has not been able to get her cholesterol Her church has fish fry on Fridays She has given up fried food completely, eating wheat bread Had to cook two bread puddings this morning With healthy eating, she has lost five pounds in 3 weeks  High cholesterol; was on pravastatin and it caused itching; had muscle aches too TG were 514, up from 232  Type 2 diabetes; cut back on the white bread when she eats it; last A1c 11.4; dry mouth, blurred vision; getting up at night to urinate; back on medicine, just once a night now Now on 2,000 mg of metformin daily and 30 units of lantus a day; no low sugars  Vitamin D deficiency; she is taking her vit D now Rx  BP Readings from Last 3 Encounters:  03/20/17 122/76  02/26/17 136/64  07/26/16 (!) 126/55    Depression screen PHQ 2/9 02/26/2017 12/28/2015 04/14/2015 02/20/2015  Decreased Interest 0 0 1 3  Down, Depressed, Hopeless 1 0 0 3  PHQ - 2 Score 1 0 1 6  Altered sleeping - - - 0  Tired, decreased energy - - - 3  Change in appetite - - - 0  Feeling bad or failure about yourself  - - - 0  Trouble concentrating - - - 3  Moving slowly or fidgety/restless - - - 0  Suicidal thoughts - - - 0  PHQ-9 Score - - - 12  Difficult doing work/chores - - - Very difficult    Relevant past medical, surgical, family and social history reviewed Past Medical History:  Diagnosis Date  . Anxiety   . Arthritis   . Blood  transfusion without reported diagnosis    previously  . Depression   . Diabetes mellitus without complication ([place])   . Elevated platelet count 01/19/2016  . GERD (gastroesophageal reflux disease)   . Hyperlipidemia   . Proptosis 01/19/2016   Past Surgical History:  Procedure Laterality Date  . BREAST BIOPSY Left 2015?   left  . [place]   Had 3  . CHOLECYSTECTOMY  1997   Family History  Problem Relation Age of Onset  . Hypertension Mother   . Cervical cancer Mother   . Depression Brother   . Stroke Father   . Ulcers Father   . Diabetes Maternal Grandmother   . Brain cancer Paternal Grandmother   . Hypertension Paternal Grandfather    Social History   Social History  . Marital status: Married    Spouse name: N/A  . Number of children: 7  . Years of education: N/A   Occupational History  . Not on file.   Social History Main Topics  . Smoking status: Current Every Day Smoker    Packs/day: 0.50  Years: 20.00    Types: Cigarettes    Start date: 03/20/1997  . Smokeless tobacco: Never Used     Comment: patient states she is working on it now  . Alcohol use No  . Drug use: No  . Sexual activity: Yes    Partners: Male   Other Topics Concern  . Not on file   Social History Narrative  . No narrative on file    Interim medical history since last visit reviewed. Allergies and medications reviewed  Review of Systems Per HPI unless specifically indicated above     Objective:    BP 122/76 (BP Location: Right Arm, Patient Position: Sitting, Cuff Size: Large)   Pulse (!) 102   Temp 97.9 F (36.6 C) (Oral)   Resp 18   Wt 214 lb 4.8 oz (97.2 kg)   LMP 04/14/2015   SpO2 99%   BMI 33.56 kg/m   Wt Readings from Last 3 Encounters:  03/20/17 214 lb 4.8 oz (97.2 kg)  02/26/17 219 lb 3.2 oz (99.4 kg)  07/26/16 180 lb (81.6 kg)    Physical Exam  Constitutional: She appears well-developed and well-nourished. No distress.  HENT:  Head:  Normocephalic and atraumatic.  Eyes: EOM are normal. No scleral icterus.  Neck: No thyromegaly present.  Cardiovascular: Normal rate, regular rhythm and normal heart sounds.   No murmur heard. Pulmonary/Chest: Effort normal and breath sounds normal. No respiratory distress. She has no wheezes.  Abdominal: Soft. Bowel sounds are normal. She exhibits no distension.  Musculoskeletal: Normal range of motion. She exhibits no edema.  Neurological: She is alert. She exhibits normal muscle tone.  Skin: Skin is warm and dry. She is not diaphoretic. No pallor.  Psychiatric: She has a normal mood and affect. Her behavior is normal. Judgment and thought content normal.   Diabetic Foot Form - Detailed   Diabetic Foot Exam - detailed Diabetic Foot exam was performed with the following findings:  Yes 03/27/2017  8:55 AM  Visual Foot Exam completed.:  Yes  Pulse Foot Exam completed.:  Yes  Right Dorsalis Pedis:  Present Left Dorsalis Pedis:  Present  Sensory Foot Exam Completed.:  Yes Semmes-Weinstein Monofilament Test R Site 1-Great Toe:  Pos L Site 1-Great Toe:  Pos        Results for orders placed or performed in visit on 02/26/17  Microalbumin / creatinine urine ratio  Result Value Ref Range   Creatinine, Urine 46 20 - 320 mg/dL   Microalb, Ur 0.8 Not estab mg/dL   Microalb Creat Ratio 17 <30 mcg/mg creat  Lipid panel  Result Value Ref Range   Cholesterol 232 (H) <200 mg/dL   Triglycerides 514 (H) <150 mg/dL   HDL 30 (L) >50 mg/dL   Total CHOL/HDL Ratio 7.7 (H) <5.0 Ratio   VLDL NOT CALC <30 mg/dL   LDL Cholesterol NOT CALC <100 mg/dL  Hemoglobin A1c  Result Value Ref Range   Hgb A1c MFr Bld 11.4 (H) <5.7 %   Mean Plasma Glucose 280 mg/dL  COMPLETE METABOLIC PANEL WITH GFR  Result Value Ref Range   Sodium 136 135 - 146 mmol/L   Potassium 3.8 3.5 - 5.3 mmol/L   Chloride 100 98 - 110 mmol/L   CO2 26 20 - 31 mmol/L   Glucose, Bld 476 (H) 65 - 99 mg/dL   BUN 11 7 - 25 mg/dL   Creat  0.88 0.50 - 1.05 mg/dL   Total Bilirubin 0.3 0.2 - 1.2 mg/dL  Alkaline Phosphatase 118 33 - 130 U/L   AST 12 10 - 35 U/L   ALT 12 6 - 29 U/L   Total Protein 6.9 6.1 - 8.1 g/dL   Albumin 4.0 3.6 - 5.1 g/dL   Calcium 9.8 8.6 - 10.4 mg/dL   GFR, Est African American 84 >=60 mL/min   GFR, Est Non African American 73 >=60 mL/min  VITAMIN D 25 Hydroxy (Vit-D Deficiency, Fractures)  Result Value Ref Range   Vit D, 25-Hydroxy 12 (L) 30 - 100 ng/mL  Lipase  Result Value Ref Range   Lipase 15 7 - 60 U/L  Amylase  Result Value Ref Range   Amylase 38 21 - 101 U/L  CBC with Differential/Platelet  Result Value Ref Range   WBC 8.3 3.8 - 10.8 K/uL   RBC 4.59 3.80 - 5.10 MIL/uL   Hemoglobin 12.5 11.7 - 15.5 g/dL   HCT 37.7 35.0 - 45.0 %   MCV 82.1 80.0 - 100.0 fL   MCH 27.2 27.0 - 33.0 pg   MCHC 33.2 32.0 - 36.0 g/dL   RDW 14.0 11.0 - 15.0 %   Platelets 378 140 - 400 K/uL   MPV 9.1 7.5 - 12.5 fL   Neutro Abs 4,980 1,500 - 7,800 cells/uL   Lymphs Abs 2,739 850 - 3,900 cells/uL   Monocytes Absolute 249 200 - 950 cells/uL   Eosinophils Absolute 249 15 - 500 cells/uL   Basophils Absolute 83 0 - 200 cells/uL   Neutrophils Relative % 60 %   Lymphocytes Relative 33 %   Monocytes Relative 3 %   Eosinophils Relative 3 %   Basophils Relative 1 %   Smear Review Criteria for review not met       Assessment & Plan:   Problem List Items Addressed This Visit      Endocrine   Type II diabetes mellitus, uncontrolled ([place])    Foot exam done today; encouraged healthy living, eye exams, weight loss      Relevant Medications   metFORMIN (GLUCOPHAGE-XR) 500 MG 24 hr tablet   ACCU-CHEK FASTCLIX LANCETS MISC   Pitavastatin Calcium (LIVALO) 2 MG TABS     Other   Vitamin D deficiency    supplement      Obesity (BMI 30.0-34.9)    Encouraged weight loss      Relevant Medications   metFORMIN (GLUCOPHAGE-XR) 500 MG 24 hr tablet   Hypercholesterolemia with hypertriglyceridemia    Trying to get  medicines approved; need to get LDL and TG down, healthy eating encouraged      Relevant Medications   Pitavastatin Calcium (LIVALO) 2 MG TABS   Icosapent Ethyl (VASCEPA) 1 g CAPS       Follow up plan: Return in about 2 months (around 06/02/2017) for twenty minute follow-up with fasting labs.  An after-visit summary was printed and given to the patient at [facility].  Please see the patient instructions which may contain other information and recommendations beyond what is mentioned above in the assessment and plan.  Meds ordered this encounter  Medications  . metFORMIN (GLUCOPHAGE-XR) 500 MG 24 hr tablet    Sig: Take 4 tablets (2,000 mg total) by mouth daily with breakfast.    Dispense:  360 tablet    Refill:  1  . ACCU-CHEK FASTCLIX LANCETS MISC    Sig: 1 Device by Does not apply route 3 (three) times daily. Dx E11.65, LON 99 months    Dispense:  100 each  Refill:  5  . glucose blood test strip    Sig: Check sugars 3x a day; LON 99 months, Dx E11.65    Dispense:  100 each    Refill:  5  . Pitavastatin Calcium (LIVALO) 2 MG TABS    Sig: Take 1 tablet (2 mg total) by mouth at bedtime. Failed atorvastatin and pravastatin    Dispense:  30 tablet    Refill:  2  . Icosapent Ethyl (VASCEPA) 1 g CAPS    Sig: Take 2 capsules by mouth 2 (two) times daily. I do not want patient on statin plus fibric acid derivative; please cover    Dispense:  120 capsule    Refill:  5    No orders of the defined types were placed in this encounter.

## 2017-03-21 NOTE — Telephone Encounter (Signed)
I called [facility] After hours, so I will have to call back on Monday

## 2017-03-24 NOTE — Telephone Encounter (Signed)
Paperwork completed, submitted to [employer] processing manager

## 2017-03-27 DIAGNOSIS — E669 Obesity, unspecified: Secondary | ICD-10-CM | POA: Insufficient documentation

## 2017-03-27 NOTE — Assessment & Plan Note (Signed)
Foot exam done today; encouraged healthy living, eye exams, weight loss

## 2017-03-27 NOTE — Assessment & Plan Note (Signed)
supplement

## 2017-03-27 NOTE — Assessment & Plan Note (Signed)
Encouraged weight loss 

## 2017-03-27 NOTE — Assessment & Plan Note (Signed)
Trying to get medicines approved; need to get LDL and TG down, healthy eating encouraged

## 2017-05-08 ENCOUNTER — Telehealth: Payer: Self-pay | Admitting: Family Medicine

## 2017-05-08 NOTE — Telephone Encounter (Signed)
Please check on the H pylori test; it still says preliminary in my view Thank you

## 2017-05-09 NOTE — Telephone Encounter (Signed)
Angie states patient never brought specimen back?

## 2017-05-09 NOTE — Telephone Encounter (Signed)
Patient states she will do test.

## 2017-05-09 NOTE — Telephone Encounter (Signed)
Please contact her If having any symptoms (abd pain, reflux, etc.), then please ask her to get this test completed If she is 100% better, please document and cancel the test Thank you

## 2017-05-22 ENCOUNTER — Encounter: Admission: RE | Payer: Self-pay | Source: Ambulatory Visit

## 2017-05-22 ENCOUNTER — Ambulatory Visit: Admission: RE | Admit: 2017-05-22 | Payer: Medicaid Other | Source: Ambulatory Visit | Admitting: Gastroenterology

## 2017-05-22 ENCOUNTER — Other Ambulatory Visit: Payer: Self-pay

## 2017-05-22 DIAGNOSIS — Z1211 Encounter for screening for malignant neoplasm of colon: Secondary | ICD-10-CM

## 2017-05-22 SURGERY — COLONOSCOPY WITH PROPOFOL
Anesthesia: General

## 2017-06-03 ENCOUNTER — Ambulatory Visit: Payer: Medicaid Other | Admitting: Family Medicine

## 2017-06-05 ENCOUNTER — Telehealth: Payer: Self-pay

## 2017-06-05 NOTE — Telephone Encounter (Signed)
Called to advise pt that Dr. Vicente Males was unavailable for 10/15.  Patient consented to move to 10/16. Also requested paperwork be re-sent. Verified mailing address.

## 2017-06-24 ENCOUNTER — Ambulatory Visit: Admission: RE | Admit: 2017-06-24 | Payer: Medicaid Other | Source: Ambulatory Visit | Admitting: Gastroenterology

## 2017-06-24 ENCOUNTER — Encounter: Admission: RE | Payer: Self-pay | Source: Ambulatory Visit

## 2017-06-24 SURGERY — COLONOSCOPY WITH PROPOFOL
Anesthesia: General

## 2017-09-26 ENCOUNTER — Other Ambulatory Visit: Payer: Self-pay | Admitting: Family Medicine

## 2017-09-26 NOTE — Telephone Encounter (Signed)
Patient has an appt next week

## 2017-09-29 ENCOUNTER — Ambulatory Visit (INDEPENDENT_AMBULATORY_CARE_PROVIDER_SITE_OTHER): Payer: Medicaid Other | Admitting: Family Medicine

## 2017-09-29 ENCOUNTER — Encounter: Payer: Self-pay | Admitting: Family Medicine

## 2017-09-29 VITALS — BP 128/68 | HR 92 | Temp 97.9°F | Ht 67.5 in | Wt 214.3 lb

## 2017-09-29 DIAGNOSIS — Z1231 Encounter for screening mammogram for malignant neoplasm of breast: Secondary | ICD-10-CM

## 2017-09-29 DIAGNOSIS — E119 Type 2 diabetes mellitus without complications: Secondary | ICD-10-CM | POA: Diagnosis not present

## 2017-09-29 DIAGNOSIS — F17200 Nicotine dependence, unspecified, uncomplicated: Secondary | ICD-10-CM

## 2017-09-29 DIAGNOSIS — Z5181 Encounter for therapeutic drug level monitoring: Secondary | ICD-10-CM | POA: Diagnosis not present

## 2017-09-29 DIAGNOSIS — F17210 Nicotine dependence, cigarettes, uncomplicated: Secondary | ICD-10-CM | POA: Diagnosis not present

## 2017-09-29 DIAGNOSIS — E1165 Type 2 diabetes mellitus with hyperglycemia: Secondary | ICD-10-CM

## 2017-09-29 DIAGNOSIS — R7989 Other specified abnormal findings of blood chemistry: Secondary | ICD-10-CM | POA: Diagnosis not present

## 2017-09-29 DIAGNOSIS — E559 Vitamin D deficiency, unspecified: Secondary | ICD-10-CM

## 2017-09-29 DIAGNOSIS — S86002A Unspecified injury of left Achilles tendon, initial encounter: Secondary | ICD-10-CM | POA: Diagnosis not present

## 2017-09-29 DIAGNOSIS — Z23 Encounter for immunization: Secondary | ICD-10-CM | POA: Diagnosis not present

## 2017-09-29 DIAGNOSIS — K219 Gastro-esophageal reflux disease without esophagitis: Secondary | ICD-10-CM

## 2017-09-29 DIAGNOSIS — E669 Obesity, unspecified: Secondary | ICD-10-CM

## 2017-09-29 DIAGNOSIS — E782 Mixed hyperlipidemia: Secondary | ICD-10-CM

## 2017-09-29 DIAGNOSIS — Z794 Long term (current) use of insulin: Secondary | ICD-10-CM

## 2017-09-29 MED ORDER — ACCU-CHEK FASTCLIX LANCETS MISC: 1.0000 | Freq: Three times a day (TID) | 5 refills | Status: DC

## 2017-09-29 MED ORDER — TRAZODONE HCL 100 MG PO TABS: 100.0000 mg | ORAL_TABLET | Freq: Every day | ORAL | 5 refills | Status: DC

## 2017-09-29 MED ORDER — INSULIN PEN NEEDLE 31G X 8 MM MISC: 1 refills | Status: DC

## 2017-09-29 MED ORDER — RANITIDINE HCL 150 MG PO TABS: 150.0000 mg | ORAL_TABLET | Freq: Two times a day (BID) | ORAL | 1 refills | Status: DC | PRN

## 2017-09-29 MED ORDER — GLUCOSE BLOOD VI STRP: ORAL_STRIP | 5 refills | Status: DC

## 2017-09-29 MED ORDER — VARENICLINE TARTRATE 0.5 MG X 11 & 1 MG X 42 PO MISC: ORAL | 0 refills | Status: DC

## 2017-09-29 MED ORDER — PITAVASTATIN CALCIUM 2 MG PO TABS: 1.0000 | ORAL_TABLET | Freq: Every day | ORAL | 1 refills | Status: DC

## 2017-09-29 MED ORDER — METFORMIN HCL ER 500 MG PO TB24: 2000.0000 mg | ORAL_TABLET | Freq: Every day | ORAL | 1 refills | Status: DC

## 2017-09-29 MED ORDER — INSULIN GLARGINE 100 UNIT/ML SOLOSTAR PEN: 30.0000 [IU] | PEN_INJECTOR | Freq: Every day | SUBCUTANEOUS | 5 refills | Status: DC

## 2017-09-29 NOTE — Progress Notes (Signed)
BP 128/68 (BP Location: Left Arm, Patient Position: Sitting, Cuff Size: Large)   Pulse 92   Temp 97.9 F (36.6 C) (Oral)   Ht 5' 7.5" (1.715 m)   Wt 214 lb 4.8 oz (97.2 kg)   LMP 04/14/2015   SpO2 99%   BMI 33.07 kg/m    Subjective:    Patient ID: Faith Smith, female    DOB: 1960-09-27, 57 y.o.   MRN: [id number]  HPI: Faith Smith is a 57 y.o. female  Chief Complaint  Patient presents with  . Foot Pain    Left foot pain, worse than before, shooting pain at night  . Immunizations    flu shot today   . Medication Refill    HPI  Patient has had pain in the LEFT posterior heel for about a month Has not tried ice or heat Worse with pushing on it and walking Not a chronic issue, first time  Type 2 diabetes; doing well; checking sugars 3x a day Highest FSBS in the last 2 weeks about 160 Lowest FSBS in the last 2 weeks about 96 No really low sugars Eye visit 2017 Still on metformin, needs the brand name b/c the generic runs her to the bathroom; has not been taking b/c of the bathroom issue  High cholesterol; 2 eggs a week; lots of cheese, willing to cut back; no milk; some fatty meats She has been off of cholesterol medicine for a few months  GERD; knows her triggers; off of PPI  Vit D def, feels drained  She smokes 1/2 ppd; tried to quit cold [place]; that didn't work, willing to try chantix  Depression screen [facility] 2/9 09/29/2017 02/26/2017 12/28/2015 04/14/2015 02/20/2015  Decreased Interest 2 0 0 1 3  Down, Depressed, Hopeless 2 1 0 0 3  PHQ - 2 Score 4 1 0 1 6  Altered sleeping 1 - - - 0  Tired, decreased energy 2 - - - 3  Change in appetite 2 - - - 0  Feeling bad or failure about yourself  1 - - - 0  Trouble concentrating 0 - - - 3  Moving slowly or fidgety/restless 0 - - - 0  Suicidal thoughts 0 - - - 0  PHQ-9 Score 10 - - - 12  Difficult doing work/chores - - - - Very difficult    Relevant past medical, surgical, family and social history reviewed Past  Medical History:  Diagnosis Date  . Anxiety   . Arthritis   . Blood transfusion without reported diagnosis    previously  . Depression   . Diabetes mellitus without complication ([place])   . Elevated platelet count 01/19/2016  . GERD (gastroesophageal reflux disease)   . Hyperlipidemia   . Proptosis 01/19/2016   Past Surgical History:  Procedure Laterality Date  . BREAST BIOPSY Left 2015?   left  . [place]   Had 3  . CHOLECYSTECTOMY  1997   Family History  Problem Relation Age of Onset  . Hypertension Mother   . Cervical cancer Mother   . Depression Brother   . Stroke Father   . Ulcers Father   . Diabetes Maternal Grandmother   . Brain cancer Paternal Grandmother   . Hypertension Paternal Grandfather    Social History   Tobacco Use  . Smoking status: Current Every Day Smoker    Packs/day: 0.50    Years: 20.00    Pack years: 10.00  Types: Cigarettes    Start date: 03/20/1997  . Smokeless tobacco: Never Used  . Tobacco comment: patient states she is working on it now  Substance Use Topics  . Alcohol use: No    Alcohol/week: 0.0 oz  . Drug use: No    Interim medical history since last visit reviewed. Allergies and medications reviewed  Review of Systems Per HPI unless specifically indicated above     Objective:    BP 128/68 (BP Location: Left Arm, Patient Position: Sitting, Cuff Size: Large)   Pulse 92   Temp 97.9 F (36.6 C) (Oral)   Ht 5' 7.5" (1.715 m)   Wt 214 lb 4.8 oz (97.2 kg)   LMP 04/14/2015   SpO2 99%   BMI 33.07 kg/m   Wt Readings from Last 3 Encounters:  09/29/17 214 lb 4.8 oz (97.2 kg)  03/20/17 214 lb 4.8 oz (97.2 kg)  02/26/17 219 lb 3.2 oz (99.4 kg)    Physical Exam  Constitutional: She appears well-developed and well-nourished. No distress.  HENT:  Head: Normocephalic and atraumatic.  Eyes: EOM are normal. No scleral icterus.  Neck: No thyromegaly present.  Cardiovascular: Normal rate, regular rhythm  and normal heart sounds.  No murmur heard. Pulmonary/Chest: Effort normal and breath sounds normal. No respiratory distress. She has no wheezes.  Abdominal: Soft. Bowel sounds are normal. She exhibits no distension.  Musculoskeletal: Normal range of motion. She exhibits no edema.  Neurological: She is alert. She exhibits normal muscle tone.  Skin: Skin is warm and dry. She is not diaphoretic. No pallor.  Psychiatric: She has a normal mood and affect. Her behavior is normal. Judgment and thought content normal.   Diabetic Foot Form - Detailed   Diabetic Foot Exam - detailed Diabetic Foot exam was performed with the following findings:  Yes 10/06/2017  2:22 PM  Visual Foot Exam completed.:  Yes  Pulse Foot Exam completed.:  Yes  Right Dorsalis Pedis:  Present Left Dorsalis Pedis:  Present  Sensory Foot Exam Completed.:  Yes Semmes-Weinstein Monofilament Test R Site 1-Great Toe:  Pos L Site 1-Great Toe:  Pos          Assessment & Plan:   Problem List Items Addressed This Visit      Digestive   GERD (gastroesophageal reflux disease)    Change PPI to H2 blocker, limit triggers      Relevant Medications   ranitidine (ZANTAC) 150 MG tablet     Endocrine   Type II diabetes mellitus, uncontrolled ([place])    Foot exam by MD today; check labs; back on Glucophage brand name to minimize GI upset; limit sweets      Relevant Medications   Pitavastatin Calcium (LIVALO) 2 MG TABS   metFORMIN (GLUCOPHAGE-XR) 500 MG 24 hr tablet   Insulin Pen Needle (ULTICARE SHORT PEN NEEDLES) 31G X 8 MM MISC   Insulin Glargine (LANTUS SOLOSTAR) 100 UNIT/ML Solostar Pen   glucose blood test strip   ACCU-CHEK FASTCLIX LANCETS MISC   Other Relevant Orders   Ambulatory referral to Optometry   Lipid panel (Completed)   Microalbumin / creatinine urine ratio (Completed)     Other   Vitamin D deficiency    Check vit D and supplement if needed      Obesity (BMI 30.0-34.9)    encuragement givne, she will  lose another 5-10 over the next few months; try for 199 pounds over 6 months      Medication monitoring encounter  Check liver and kidneys      Relevant Orders   COMPLETE METABOLIC PANEL WITH GFR (Completed)   Hypercholesterolemia with hypertriglyceridemia    Check lipids today; not fasting      Relevant Medications   Pitavastatin Calcium (LIVALO) 2 MG TABS   Other Relevant Orders   Lipid panel (Completed)   Elevated platelet count    Check CBC      Relevant Orders   CBC with Differential/Platelet (Completed)   Current smoker    Encouraged patient to quit; Discussed different forms of smoking cessation with the patient, and I encouraged cessation. Patient requests trial of Chantix.  I discussed common side effects including but not limited to nausea/vomiting, vivid dreams, behaviour changes, depression, suicidal ideation; also explained possible increased cardiovascular risk. After discussion, patient opts to try quitting with Chantix. The 1-800-QUIT-NOW number given in patient instructions. Patient was encouraged to choose a quit date about 1 week after starting medicine.       Other Visit Diagnoses    Encounter for screening mammogram for breast cancer    -  Primary   Relevant Orders   MM DIGITAL SCREENING BILATERAL   Controlled type 2 diabetes mellitus without complication, with long-term current use of insulin (HCC)       Relevant Medications   Pitavastatin Calcium (LIVALO) 2 MG TABS   metFORMIN (GLUCOPHAGE-XR) 500 MG 24 hr tablet   Insulin Pen Needle (ULTICARE SHORT PEN NEEDLES) 31G X 8 MM MISC   Insulin Glargine (LANTUS SOLOSTAR) 100 UNIT/ML Solostar Pen   glucose blood test strip   ACCU-CHEK FASTCLIX LANCETS MISC   Other Relevant Orders   Hemoglobin A1c (Completed)   Flu vaccine need       Relevant Orders   Flu Vaccine QUAD 36+ mos IM (Completed)   Pneumococcal vaccination given       Achilles tendon injury, left, initial encounter       Relevant Orders    Ambulatory referral to Podiatry   Ambulatory referral to Ophthalmology       Follow up plan: Return in about 3 months (around 12/28/2017) for follow-up visit with Dr. Sanda Klein.  An after-visit summary was printed and given to the patient at [facility].  Please see the patient instructions which may contain other information and recommendations beyond what is mentioned above in the assessment and plan.  Meds ordered this encounter  Medications  . traZODone (DESYREL) 100 MG tablet    Sig: Take 1 tablet (100 mg total) by mouth at bedtime.    Dispense:  30 tablet    Refill:  5  . Pitavastatin Calcium (LIVALO) 2 MG TABS    Sig: Take 1 tablet (2 mg total) by mouth at bedtime. Failed atorvastatin and pravastatin    Dispense:  90 tablet    Refill:  1  . metFORMIN (GLUCOPHAGE-XR) 500 MG 24 hr tablet    Sig: Take 4 tablets (2,000 mg total) by mouth daily with breakfast.    Dispense:  360 tablet    Refill:  1    BRAND NAME MEDICALLY NECESSARY, DISPENSE AS WRITTEN GLUCOPHAGE XR  . Insulin Pen Needle (ULTICARE SHORT PEN NEEDLES) 31G X 8 MM MISC    Sig: FOR USE WITH INSULIN PEN ONCE A DAY    Dispense:  100 each    Refill:  1  . Insulin Glargine (LANTUS SOLOSTAR) 100 UNIT/ML Solostar Pen    Sig: Inject 30 Units into the skin at bedtime.    Dispense:  15  mL    Refill:  5  . glucose blood test strip    Sig: Check sugars 3x a day; LON 99 months, Dx E11.65    Dispense:  100 each    Refill:  5  . ACCU-CHEK FASTCLIX LANCETS MISC    Sig: 1 Device by Does not apply route 3 (three) times daily. Dx E11.65, LON 99 months    Dispense:  100 each    Refill:  5  . ranitidine (ZANTAC) 150 MG tablet    Sig: Take 1 tablet (150 mg total) by mouth 2 (two) times daily as needed for heartburn.    Dispense:  180 tablet    Refill:  1  . varenicline (CHANTIX STARTING MONTH PAK) 0.5 MG X 11 & 1 MG X 42 tablet    Sig: Take one 0.5 mg tablet by mouth once daily for 3 days, then increase to one 0.5 mg tablet twice daily  for 4 days, then increase to one 1 mg tablet twice daily.    Dispense:  53 tablet    Refill:  0    Orders Placed This Encounter  Procedures  . MM DIGITAL SCREENING BILATERAL  . Flu Vaccine QUAD 36+ mos IM  . Hemoglobin A1c  . Lipid panel  . COMPLETE METABOLIC PANEL WITH GFR  . Microalbumin / creatinine urine ratio  . CBC with Differential/Platelet  . Ambulatory referral to Podiatry  . Ambulatory referral to Ophthalmology  . Ambulatory referral to Optometry     More than 3 minutes spent counseling on smoking cessation

## 2017-09-29 NOTE — Assessment & Plan Note (Signed)
Check CBC 

## 2017-09-29 NOTE — Assessment & Plan Note (Signed)
Check liver and kidneys 

## 2017-09-29 NOTE — Assessment & Plan Note (Signed)
Change PPI to H2 blocker, limit triggers

## 2017-09-29 NOTE — Assessment & Plan Note (Signed)
Check vit D and supplement if needed 

## 2017-09-29 NOTE — Assessment & Plan Note (Signed)
Foot exam by MD today; check labs; back on Glucophage brand name to minimize GI upset; limit sweets

## 2017-09-29 NOTE — Patient Instructions (Addendum)
I do encourage you to quit smoking Call [phone] to sign up for smoking cessation classes You can call 1-800-QUIT-NOW to talk with a smoking cessation coach Try to limit saturated fats in your diet (bologna, hot dogs, barbeque, cheeseburgers, hamburgers, steak, bacon, sausage, cheese, etc.) and get more fresh fruits, vegetables, and whole grains  Steps to Quit Smoking Smoking tobacco can be bad for your health. It can also affect almost every organ in your body. Smoking puts you and people around you at risk for many serious long-lasting (chronic) diseases. Quitting smoking is hard, but it is one of the best things that you can do for your health. It is never too late to quit. What are the benefits of quitting smoking? When you quit smoking, you lower your risk for getting serious diseases and conditions. They can include:  Lung cancer or lung disease.  Heart disease.  Stroke.  Heart attack.  Not being able to have children (infertility).  Weak bones (osteoporosis) and broken bones (fractures).  If you have coughing, wheezing, and shortness of breath, those symptoms may get better when you quit. You may also get sick less often. If you are pregnant, quitting smoking can help to lower your chances of having a baby of low birth weight. What can I do to help me quit smoking? Talk with your doctor about what can help you quit smoking. Some things you can do (strategies) include:  Quitting smoking totally, instead of slowly cutting back how much you smoke over a period of time.  Going to in-person counseling. You are more likely to quit if you go to many counseling sessions.  Using resources and support systems, such as: ? Online chats with a counselor. ? Phone quitlines. ? Printed self-help materials. ? Support groups or group counseling. ? Text messaging programs. ? Mobile phone apps or applications.  Taking medicines. Some of these medicines may have nicotine in them. If you  are pregnant or breastfeeding, do not take any medicines to quit smoking unless your doctor says it is okay. Talk with your doctor about counseling or other things that can help you.  Talk with your doctor about using more than one strategy at the same time, such as taking medicines while you are also going to in-person counseling. This can help make quitting easier. What things can I do to make it easier to quit? Quitting smoking might feel very hard at first, but there is a lot that you can do to make it easier. Take these steps:  Talk to your family and friends. Ask them to support and encourage you.  Call phone quitlines, reach out to support groups, or work with a counselor.  Ask people who smoke to not smoke around you.  Avoid places that make you want (trigger) to smoke, such as: ? Bars. ? Parties. ? Smoke-break areas at work.  Spend time with people who do not smoke.  Lower the stress in your life. Stress can make you want to smoke. Try these things to help your stress: ? Getting regular exercise. ? Deep-breathing exercises. ? Yoga. ? Meditating. ? Doing a body scan. To do this, close your eyes, focus on one area of your body at a time from head to toe, and notice which parts of your body are tense. Try to relax the muscles in those areas.  Download or buy apps on your mobile phone or tablet that can help you stick to your quit plan. There are many free apps,   apps, such as QuitGuide from the [employer] Office manager for Disease Control and Prevention). You can find more support from smokefree.gov and other websites.  This information is not intended to replace advice given to you by your health care provider. Make sure you discuss any questions you have with your health care provider. Document Released: 06/22/2009 Document Revised: 04/23/2016 Document Reviewed: 01/10/2015 Elsevier Interactive Patient Education  2018 [employer].

## 2017-09-29 NOTE — Assessment & Plan Note (Signed)
encuragement givne, she will lose another 5-10 over the next few months; try for 199 pounds over 6 months

## 2017-09-29 NOTE — Assessment & Plan Note (Addendum)
Encouraged patient to quit; Discussed different forms of smoking cessation with the patient, and I encouraged cessation. Patient requests trial of Chantix.  I discussed common side effects including but not limited to nausea/vomiting, vivid dreams, behaviour changes, depression, suicidal ideation; also explained possible increased cardiovascular risk. After discussion, patient opts to try quitting with Chantix. The 1-800-QUIT-NOW number given in patient instructions. Patient was encouraged to choose a quit date about 1 week after starting medicine.

## 2017-09-29 NOTE — Assessment & Plan Note (Signed)
Check lipids today; not fasting

## 2017-09-30 LAB — COMPLETE METABOLIC PANEL WITH GFR
AG RATIO: 1.6 (calc) (ref 1.0–2.5)
ALT: 16 U/L (ref 6–29)
AST: 14 U/L (ref 10–35)
Albumin: 4.2 g/dL (ref 3.6–5.1)
Alkaline phosphatase (APISO): 129 U/L (ref 33–130)
BUN: 12 mg/dL (ref 7–25)
CALCIUM: 9.6 mg/dL (ref 8.6–10.4)
CO2: 27 mmol/L (ref 20–32)
Chloride: 99 mmol/L (ref 98–110)
Creat: 0.96 mg/dL (ref 0.50–1.05)
GFR, EST NON AFRICAN AMERICAN: 65 mL/min/{1.73_m2} (ref >=60)
GFR, Est African American: 76 mL/min/{1.73_m2} (ref >=60)
GLOBULIN: 2.7 g/dL (ref 1.9–3.7)
Glucose, Bld: 393 mg/dL — ABNORMAL HIGH (ref 65–139)
POTASSIUM: 3.8 mmol/L (ref 3.5–5.3)
SODIUM: 135 mmol/L (ref 135–146)
Total Bilirubin: 0.2 mg/dL (ref 0.2–1.2)
Total Protein: 6.9 g/dL (ref 6.1–8.1)

## 2017-09-30 LAB — HEMOGLOBIN A1C
Hgb A1c MFr Bld: 11.3 %{Hb} — ABNORMAL HIGH
Mean Plasma Glucose: 278 (calc)
eAG (mmol/L): 15.4 (calc)

## 2017-09-30 LAB — CBC WITH DIFFERENTIAL/PLATELET
BASOS ABS: 61 {cells}/uL (ref 0–200)
BASOS PCT: 0.8 %
EOS ABS: 220 {cells}/uL (ref 15–500)
EOS PCT: 2.9 %
HEMATOCRIT: 36.6 % (ref 35.0–45.0)
HEMOGLOBIN: 12.6 g/dL (ref 11.7–15.5)
Lymphs Abs: 3078 cells/uL (ref 850–3900)
MCH: 27.4 pg (ref 27.0–33.0)
MCHC: 34.4 g/dL (ref 32.0–36.0)
MCV: 79.6 fL — ABNORMAL LOW (ref 80.0–100.0)
MONOS PCT: 4.1 %
MPV: 10.3 fL (ref 7.5–12.5)
NEUTROS ABS: 3929 {cells}/uL (ref 1500–7800)
Neutrophils Relative %: 51.7 %
Platelets: 411 10*3/uL — ABNORMAL HIGH (ref 140–400)
RBC: 4.6 10*6/uL (ref 3.80–5.10)
RDW: 13.5 % (ref 11.0–15.0)
Total Lymphocyte: 40.5 %
WBC mixed population: 312 cells/uL (ref 200–950)
WBC: 7.6 10*3/uL (ref 3.8–10.8)

## 2017-09-30 LAB — LIPID PANEL
CHOLESTEROL: 244 mg/dL — AB (ref <200)
HDL: 32 mg/dL — AB (ref >50)
Non-HDL Cholesterol (Calc): 212 mg/dL (calc) — ABNORMAL HIGH (ref <130)
Total CHOL/HDL Ratio: 7.6 (calc) — ABNORMAL HIGH (ref <5.0)
Triglycerides: 459 mg/dL — ABNORMAL HIGH (ref <150)

## 2017-09-30 LAB — MICROALBUMIN / CREATININE URINE RATIO
Creatinine, Urine: 53 mg/dL (ref 20–275)
MICROALB/CREAT RATIO: 6 ug/mg{creat} (ref <30)
Microalb, Ur: 0.3 mg/dL

## 2017-10-02 ENCOUNTER — Telehealth: Payer: Self-pay

## 2017-10-02 NOTE — Telephone Encounter (Signed)
-----   Message from Arnetha Courser, MD sent at 09/30/2017  8:07 PM EST ----- Guerry Minors, please let patient know that her diabetes is indeed out of control; we are so happy that she decided to come back in and get started back on her medicine; ask her what her sugar are running back on her medicine so we can adjust further if needed; her cholesterol is also out of control, so I'm glad she's getting back on her medicine; her numbers really show that without treatment and medicine, her diabetes and cholesterol really go haywire, and could lead to a heart attack; please ask her to see me in 2-3 weeks and bring her sugar readings in and we'll continue to make changes well before her next appointment so her A1c will be great in 3 months!

## 2017-10-02 NOTE — Telephone Encounter (Signed)
Called pt's number, pt's husband answers. LM for pt w/ husband informing him of the need for her to call back to the office for lab results. Husband states he will relay message to wife (wife is currently in [place] w/ her mother who is having surgery) CRM created. Labs routed to [facility].

## 2017-10-06 ENCOUNTER — Encounter: Payer: Self-pay | Admitting: Family Medicine

## 2017-10-06 NOTE — Telephone Encounter (Signed)
Called pt, no answer, line rang busy. Unable to leave message. CRM created. Labs previously routed to [facility] triage.

## 2017-10-07 NOTE — Telephone Encounter (Signed)
Called pt informed her of the information below. Pt gave verbal understanding, will call back to schedule appt as she is caring for her mother who recently had surgery.

## 2017-11-11 ENCOUNTER — Ambulatory Visit: Payer: Self-pay | Admitting: Podiatry

## 2017-12-02 ENCOUNTER — Encounter: Payer: Self-pay | Admitting: Podiatry

## 2017-12-05 ENCOUNTER — Encounter: Payer: Self-pay | Admitting: Podiatry

## 2017-12-05 ENCOUNTER — Other Ambulatory Visit: Payer: Self-pay | Admitting: Podiatry

## 2017-12-05 ENCOUNTER — Ambulatory Visit (INDEPENDENT_AMBULATORY_CARE_PROVIDER_SITE_OTHER): Payer: Medicaid Other | Admitting: Podiatry

## 2017-12-05 ENCOUNTER — Ambulatory Visit (INDEPENDENT_AMBULATORY_CARE_PROVIDER_SITE_OTHER): Payer: Medicaid Other

## 2017-12-05 DIAGNOSIS — M7662 Achilles tendinitis, left leg: Secondary | ICD-10-CM | POA: Diagnosis not present

## 2017-12-05 DIAGNOSIS — S99922A Unspecified injury of left foot, initial encounter: Secondary | ICD-10-CM

## 2017-12-05 MED ORDER — METHYLPREDNISOLONE 4 MG PO TBPK: ORAL_TABLET | ORAL | 0 refills | Status: DC

## 2017-12-05 MED ORDER — MELOXICAM 15 MG PO TABS: 15.0000 mg | ORAL_TABLET | Freq: Every day | ORAL | 0 refills | Status: DC

## 2017-12-10 NOTE — Progress Notes (Signed)
   HPI: 57 year old female with PMHx of T2DM presenting today as a new patient with a chief complaint of intermittent pain to the left achilles region that began about one year ago. She states the pain has worsened in the last 6 months. She has not done anything to treat the symptoms. Walking and bearing weight increase the pain. Patient is here for further evaluation and treatment.   Past Medical History:  Diagnosis Date  . Anxiety   . Arthritis   . Blood transfusion without reported diagnosis    previously  . Depression   . Diabetes mellitus without complication ([place])   . Elevated platelet count 01/19/2016  . GERD (gastroesophageal reflux disease)   . Hyperlipidemia   . Proptosis 01/19/2016      Physical Exam: General: The patient is alert and oriented x3 in no acute distress.  Dermatology: Skin is warm, dry and supple bilateral lower extremities. Negative for open lesions or macerations.  Vascular: Palpable pedal pulses bilaterally. No edema or erythema noted. Capillary refill within normal limits.  Neurological: Epicritic and protective threshold grossly intact bilaterally.   Musculoskeletal Exam: Pain on palpation noted to the posterior tubercle of the left calcaneus at the insertion of the Achilles tendon consistent with retrocalcaneal bursitis. Range of motion within normal limits. Muscle strength 5/5 in all muscle groups bilateral lower extremities.  Radiographic Exam:  Posterior calcaneal spur noted to the respective calcaneus on lateral view. No fracture or dislocation noted. Normal osseous mineralization noted.     Assessment: 1. Insertional Achilles tendinitis left 2. Retrocalcaneal bursitis   Plan of Care:  1. Patient was evaluated. Radiographs were reviewed today. 2. Injection of 0.5 mL Celestone Soluspan injected into the retrocalcaneal bursa. Care was taken to avoid direct injection into the Achilles tendon. 3. CAM boot dispensed. Weightbearing as tolerated for  four weeks.  4. Prescription for Medrol Dose Pak provided to patient.  5. Prescription for Meloxicam provided to patient.  6. Return to clinic in 4 weeks.    Edrick Kins, DPM [facility]  Dr. Edrick Kins, [address]                                        [place], [place] [address]                Office [phone]  Fax [phone]

## 2017-12-26 NOTE — Progress Notes (Signed)
This encounter was created in error - please disregard.

## 2017-12-29 ENCOUNTER — Ambulatory Visit: Payer: Medicaid Other | Admitting: Family Medicine

## 2018-01-02 ENCOUNTER — Ambulatory Visit: Payer: Medicaid Other | Admitting: Podiatry

## 2018-01-02 DIAGNOSIS — M7662 Achilles tendinitis, left leg: Secondary | ICD-10-CM

## 2018-01-02 MED ORDER — TRAMADOL HCL 50 MG PO TABS: 50.0000 mg | ORAL_TABLET | Freq: Three times a day (TID) | ORAL | 0 refills | Status: DC | PRN

## 2018-01-05 NOTE — Progress Notes (Signed)
   HPI: 57 year old female with PMHx of T2DM presenting today for follow up evaluation of left insertional achilles tendinitis. She states the pain improved for a couple of days but has since returned. She states the injections, wearing the CAM boot and taking Meloxicam have not helped her symptoms. Patient is here for further evaluation and treatment.   Past Medical History:  Diagnosis Date  . Anxiety   . Arthritis   . Blood transfusion without reported diagnosis    previously  . Depression   . Diabetes mellitus without complication ([place])   . Elevated platelet count 01/19/2016  . GERD (gastroesophageal reflux disease)   . Hyperlipidemia   . Proptosis 01/19/2016      Physical Exam: General: The patient is alert and oriented x3 in no acute distress.  Dermatology: Skin is warm, dry and supple bilateral lower extremities. Negative for open lesions or macerations.  Vascular: Palpable pedal pulses bilaterally. No edema or erythema noted. Capillary refill within normal limits.  Neurological: Epicritic and protective threshold grossly intact bilaterally.   Musculoskeletal Exam: Pain on palpation noted to the posterior tubercle of the left calcaneus at the insertion of the Achilles tendon consistent with retrocalcaneal bursitis. Range of motion within normal limits. Muscle strength 5/5 in all muscle groups bilateral lower extremities.  Radiographic Exam:  Posterior calcaneal spur noted to the respective calcaneus on lateral view. No fracture or dislocation noted. Normal osseous mineralization noted.     Assessment: 1. Insertional Achilles tendinitis left - unimproved  2. Retrocalcaneal bursitis - unimproved    Plan of Care:  1. Patient was evaluated.  2. Order for MRI placed today.  3. Prescription for Tramadol 50 mg provided to patient.  4. Continue taking Meloxicam.  5. Continue weightbearing in CAM boot.  6. Return to clinic in 3 weeks to discuss MRI findings.    Edrick Kins, DPM [facility]  Dr. Edrick Kins, [address]                                        [place], [place] [address]                Office [phone]  Fax [phone]

## 2018-01-07 ENCOUNTER — Telehealth: Payer: Self-pay

## 2018-01-07 NOTE — Telephone Encounter (Signed)
Per Carrington Clamp, RN with [facility], MRI has been approved from 01/07/18 to 02/06/18    Auth # [id number]  Patient has been contacted via voice mail regarding approval and she will call to schedule own MRI appt.

## 2018-01-07 NOTE — Telephone Encounter (Signed)
-----   Message from Edrick Kins, DPM sent at 01/02/2018 10:32 AM EDT ----- Regarding: MRI LT achilles tendon Please order MRI LT achilles.   Dx: insertional achilles tendinitis LT  Thanks, Dr. Amalia Hailey

## 2018-03-13 ENCOUNTER — Encounter: Payer: Self-pay | Admitting: Emergency Medicine

## 2018-03-13 ENCOUNTER — Emergency Department
Admission: EM | Admit: 2018-03-13 | Discharge: 2018-03-13 | Disposition: A | Discharge status: Home / Self Care | Payer: Medicaid Other | Attending: Emergency Medicine | Admitting: Emergency Medicine

## 2018-03-13 ENCOUNTER — Emergency Department: Payer: Medicaid Other

## 2018-03-13 ENCOUNTER — Other Ambulatory Visit: Payer: Self-pay

## 2018-03-13 DIAGNOSIS — Z794 Long term (current) use of insulin: Secondary | ICD-10-CM | POA: Diagnosis not present

## 2018-03-13 DIAGNOSIS — M25562 Pain in left knee: Secondary | ICD-10-CM | POA: Diagnosis present

## 2018-03-13 DIAGNOSIS — Z79899 Other long term (current) drug therapy: Secondary | ICD-10-CM | POA: Insufficient documentation

## 2018-03-13 DIAGNOSIS — M1712 Unilateral primary osteoarthritis, left knee: Secondary | ICD-10-CM | POA: Diagnosis not present

## 2018-03-13 DIAGNOSIS — E119 Type 2 diabetes mellitus without complications: Secondary | ICD-10-CM | POA: Diagnosis not present

## 2018-03-13 DIAGNOSIS — F17210 Nicotine dependence, cigarettes, uncomplicated: Secondary | ICD-10-CM | POA: Insufficient documentation

## 2018-03-13 DIAGNOSIS — R2242 Localized swelling, mass and lump, left lower limb: Secondary | ICD-10-CM | POA: Insufficient documentation

## 2018-03-13 MED ORDER — DICLOFENAC SODIUM 50 MG PO TBEC: 50.0000 mg | DELAYED_RELEASE_TABLET | Freq: Two times a day (BID) | ORAL | 0 refills | Status: AC

## 2018-03-13 NOTE — ED Provider Notes (Signed)
[facility] Emergency Department Provider Note ____________________________________________  Time seen: 1800  I have reviewed the triage vital signs and the nursing notes.  HISTORY  Chief Complaint  Knee Pain  HPI Faith Smith is a 57 y.o. female presents to the ED for evaluation of left knee pain and swelling.  Patient describes the last 2 to 3 days she has had increasing pain, swelling, and disability to the left knee.  She denies any preceding injury, accident, or trauma.  Patient reports she has not had knee pain for more than a year.  She has been seen previously by podiatry for heel spur.  She denies any other complaints at this time.  Past Medical History:  Diagnosis Date  . Anxiety   . Arthritis   . Blood transfusion without reported diagnosis    previously  . Depression   . Diabetes mellitus without complication ([place])   . Elevated platelet count 01/19/2016  . GERD (gastroesophageal reflux disease)   . Hyperlipidemia   . Proptosis 01/19/2016    Patient Active Problem List   Diagnosis Date Noted  . Obesity (BMI 30.0-34.9) 03/27/2017  . Fatigue 02/26/2017  . Medication monitoring encounter 02/26/2017  . Achilles tendonitis, bilateral 01/19/2016  . Elevated platelet count 01/19/2016  . Proptosis 01/19/2016  . Need for hepatitis C screening test 01/19/2016  . Stool incontinence 12/28/2015  . Colon cancer screening 12/28/2015  . Right-sided back pain 12/28/2015  . Osteoarthrosis multiple sites, not specified as generalized 04/14/2015  . Annual physical exam 04/14/2015  . Breast mass, left 04/14/2015  . Anxiety and depression 02/20/2015  . Type II diabetes mellitus, uncontrolled ([place]) 02/20/2015  . GERD (gastroesophageal reflux disease) 02/20/2015  . Hypercholesterolemia with hypertriglyceridemia 02/20/2015  . Arthralgia of multiple joints 02/20/2015  . Extreme obesity 02/20/2015  . Vitamin D deficiency 02/20/2015  . Nausea without vomiting  02/20/2015  . Insomnia 02/20/2015  . Diabetes ([place]) 03/15/2014  . Current smoker 03/15/2014  . Abdominal pain 12/15/2012    Past Surgical History:  Procedure Laterality Date  . BREAST BIOPSY Left 2015?   left  . [place]   Had 3  . CHOLECYSTECTOMY  1997    Prior to Admission medications   Medication Sig Start Date End Date Taking? Authorizing Provider  ACCU-CHEK FASTCLIX LANCETS MISC 1 Device by Does not apply route 3 (three) times daily. Dx E11.65, LON 99 months 09/29/17   Arnetha Courser, MD  Blood Gluc Meter Disp-Strips (BLOOD GLUCOSE METER DISPOSABLE) DEVI 1 each by Does not apply route 3 (three) times daily before meals. 12/28/15   Arnetha Courser, MD  diclofenac (VOLTAREN) 50 MG EC tablet Take 1 tablet (50 mg total) by mouth 2 (two) times daily. 03/13/18 04/12/18  Milca Sytsma, Dannielle Karvonen, PA-C  glucose blood test strip Check sugars 3x a day; LON 99 months, Dx E11.65 09/29/17   Lada, Satira Anis, MD  Icosapent Ethyl (VASCEPA) 1 g CAPS Take 2 capsules by mouth 2 (two) times daily. I do not want patient on statin plus fibric acid derivative; please cover Patient not taking: Reported on 09/29/2017 03/20/17   Arnetha Courser, MD  Insulin Glargine (LANTUS SOLOSTAR) 100 UNIT/ML Solostar Pen Inject 30 Units into the skin at bedtime. 09/29/17   Lada, Satira Anis, MD  Insulin Pen Needle (ULTICARE SHORT PEN NEEDLES) 31G X 8 MM MISC FOR USE WITH INSULIN PEN ONCE A DAY 09/29/17   Lada, Satira Anis, MD  meloxicam [facility])  15 MG tablet Take 1 tablet (15 mg total) by mouth daily. 12/05/17   Edrick Kins, DPM  metFORMIN (GLUCOPHAGE-XR) 500 MG 24 hr tablet Take 4 tablets (2,000 mg total) by mouth daily with breakfast. 09/29/17   Arnetha Courser, MD  methylPREDNISolone (MEDROL) 4 MG TBPK tablet Use as directed 12/05/17   Edrick Kins, DPM  Pitavastatin Calcium (LIVALO) 2 MG TABS Take 1 tablet (2 mg total) by mouth at bedtime. Failed atorvastatin and pravastatin 09/29/17   Lada, Satira Anis, MD   ranitidine (ZANTAC) 150 MG tablet Take 1 tablet (150 mg total) by mouth 2 (two) times daily as needed for heartburn. 09/29/17   Lada, Satira Anis, MD  traMADol (ULTRAM) 50 MG tablet Take 1 tablet (50 mg total) by mouth every 8 (eight) hours as needed. 01/02/18   Edrick Kins, DPM  traZODone (DESYREL) 100 MG tablet Take 1 tablet (100 mg total) by mouth at bedtime. 09/29/17   Lada, Satira Anis, MD  varenicline (CHANTIX STARTING MONTH PAK) 0.5 MG X 11 & 1 MG X 42 tablet Take one 0.5 mg tablet by mouth once daily for 3 days, then increase to one 0.5 mg tablet twice daily for 4 days, then increase to one 1 mg tablet twice daily. 09/29/17   Arnetha Courser, MD    Allergies Atorvastatin and Percocet [oxycodone-acetaminophen]  Family History  Problem Relation Age of Onset  . Hypertension Mother   . Cervical cancer Mother   . Depression Brother   . Stroke Father   . Ulcers Father   . Diabetes Maternal Grandmother   . Brain cancer Paternal Grandmother   . Hypertension Paternal Grandfather     Social History Social History   Tobacco Use  . Smoking status: Current Every Day Smoker    Packs/day: 0.50    Years: 20.00    Pack years: 10.00    Types: Cigarettes    Start date: 03/20/1997  . Smokeless tobacco: Never Used  . Tobacco comment: patient states she is working on it now  Substance Use Topics  . Alcohol use: No    Alcohol/week: 0.0 oz  . Drug use: No    Review of Systems  Constitutional: Negative for fever. Cardiovascular: Negative for chest pain. Respiratory: Negative for shortness of breath. Musculoskeletal: Negative for back pain.  Knee pain as above. Skin: Negative for rash. Neurological: Negative for headaches, focal weakness or numbness. ____________________________________________  PHYSICAL EXAM:  VITAL SIGNS: ED Triage Vitals [03/13/18 1710]  Enc Vitals Group     BP (!) 146/80     Pulse Rate 84     Resp 16     Temp 98.6 F (37 C)     Temp Source Oral     SpO2 100  %     Weight 210 lb (95.3 kg)     Height 5\' 7"  (1.702 m)     Head Circumference      Peak Flow      Pain Score 8     Pain Loc      Pain Edu?      Excl. in [place]?     Constitutional: Alert and oriented. Well appearing and in no distress. Head: Normocephalic and atraumatic. Cardiovascular: Normal rate, regular rhythm. Normal distal pulses. Respiratory: Normal respiratory effort.  Musculoskeletal: Left knee with small joint effusion on exam.  No dislocation or deformity appreciated.  Patient with tenderness to palpation around the patella.  Normal flexion and extension range on exam.  No popliteal space fullness is noted.  No calf or Achilles tenderness noted distally.  Nontender with normal range of motion in all extremities.  Neurologic: Antalgic gait without ataxia. Normal speech and language. No gross focal neurologic deficits are appreciated. Skin:  Skin is warm, dry and intact. No rash noted. ___________________________________________   RADIOLOGY  Left Knee  IMPRESSION: Degenerative changes LEFT knee and small joint effusion.  No acute osseous abnormalities. ____________________________________________  PROCEDURES  Procedures ____________________________________________  INITIAL IMPRESSION / ASSESSMENT AND PLAN / ED COURSE  Patient with ED evaluation of acute left knee pain.  Patient's exam is overall benign.  Her x-ray does confirm some moderate DJD with spurs.  The patient is discharged with a prescription for diclofenac to dose as directed.  She is referred to orthopedics for further evaluation and management.  Return precautions have been reviewed. ____________________________________________  FINAL CLINICAL IMPRESSION(S) / ED DIAGNOSES  Final diagnoses:  Primary osteoarthritis of left knee  Acute pain of left knee      Carmie End, Dannielle Karvonen, PA-C 03/13/18 1828    Carrie Mew, MD 03/13/18 2046

## 2018-03-13 NOTE — ED Triage Notes (Signed)
PT to ED via POV with c/o LFT knee pain x53yr. PT denies any new injuries. No swelling or deformity noted. VSS

## 2018-03-13 NOTE — Discharge Instructions (Addendum)
Your knee pain appears to be coming from your underlying severe arthritis and bone spurs in the joint. Take the prescription anti-inflammatory as directed. Follow-up with Dr. Posey Pronto or Dr. Sanda Klein as discussed.

## 2018-03-17 ENCOUNTER — Ambulatory Visit: Payer: Medicaid Other | Admitting: Family Medicine

## 2018-03-17 ENCOUNTER — Encounter: Payer: Self-pay | Admitting: Family Medicine

## 2018-03-17 VITALS — BP 130/84 | HR 101 | Temp 98.5°F | Resp 16 | Ht 68.0 in | Wt 215.9 lb

## 2018-03-17 DIAGNOSIS — M25562 Pain in left knee: Secondary | ICD-10-CM | POA: Diagnosis not present

## 2018-03-17 DIAGNOSIS — M1712 Unilateral primary osteoarthritis, left knee: Secondary | ICD-10-CM | POA: Diagnosis not present

## 2018-03-17 NOTE — Patient Instructions (Signed)
Please go to the [facility] clinic any time between 1pm and 7pm today for further evalaution  [facility] Address: [place], [place], [place] [address]  They are across the street from [place]; there is a [employer] and the building is set back from the road with a [employer].

## 2018-03-17 NOTE — Progress Notes (Signed)
Name: Faith Smith   MRN: [id number]    DOB: Nov 21, 1960   Date:03/17/2018       Progress Note  Subjective  Chief Complaint  Chief Complaint  Patient presents with  . Knee Pain    left knee pain seen in ER  . Referral    HPI  Pt presents with concern for LEFT knee pain - she was seen in the ER last week on 03/13/2018.  She was diagnosed with arthritis and a bone spur. She did not injury her leg in any way, but does not a history of arthritis that had been previously well controlled.  She would like a referral to an orthopedist today.  She was prescribed voltaren, but did not have the money to obtain it. Denies history of gout, denies fevers or chills, no wound to the area. She does report an episode of numbness and tingling in the right LE last night.  Review of Xray results: "IMPRESSION: Degenerative changes LEFT knee and small joint effusion. No acute osseous abnormalities. Electronically Signed   By: Lavonia Dana M.D.   On: 03/13/2018 17:48"  Patient Active Problem List   Diagnosis Date Noted  . Obesity (BMI 30.0-34.9) 03/27/2017  . Fatigue 02/26/2017  . Medication monitoring encounter 02/26/2017  . Achilles tendonitis, bilateral 01/19/2016  . Elevated platelet count 01/19/2016  . Proptosis 01/19/2016  . Need for hepatitis C screening test 01/19/2016  . Stool incontinence 12/28/2015  . Colon cancer screening 12/28/2015  . Right-sided back pain 12/28/2015  . Osteoarthrosis multiple sites, not specified as generalized 04/14/2015  . Annual physical exam 04/14/2015  . Breast mass, left 04/14/2015  . Anxiety and depression 02/20/2015  . Type II diabetes mellitus, uncontrolled ([place]) 02/20/2015  . GERD (gastroesophageal reflux disease) 02/20/2015  . Hypercholesterolemia with hypertriglyceridemia 02/20/2015  . Arthralgia of multiple joints 02/20/2015  . Extreme obesity 02/20/2015  . Vitamin D deficiency 02/20/2015  . Nausea without vomiting 02/20/2015  . Insomnia 02/20/2015  .  Diabetes ([place]) 03/15/2014  . Current smoker 03/15/2014  . Abdominal pain 12/15/2012    Social History   Tobacco Use  . Smoking status: Current Every Day Smoker    Packs/day: 0.50    Years: 20.00    Pack years: 10.00    Types: Cigarettes    Start date: 03/20/1997  . Smokeless tobacco: Never Used  . Tobacco comment: patient states she is working on it now  Substance Use Topics  . Alcohol use: No    Alcohol/week: 0.0 oz     Current Outpatient Medications:  .  ACCU-CHEK FASTCLIX LANCETS MISC, 1 Device by Does not apply route 3 (three) times daily. Dx E11.65, LON 99 months, Disp: 100 each, Rfl: 5 .  Blood Gluc Meter Disp-Strips (BLOOD GLUCOSE METER DISPOSABLE) DEVI, 1 each by Does not apply route 3 (three) times daily before meals., Disp: 100 each, Rfl: 3 .  glucose blood test strip, Check sugars 3x a day; LON 99 months, Dx E11.65, Disp: 100 each, Rfl: 5 .  Insulin Glargine (LANTUS SOLOSTAR) 100 UNIT/ML Solostar Pen, Inject 30 Units into the skin at bedtime., Disp: 15 mL, Rfl: 5 .  Insulin Pen Needle (ULTICARE SHORT PEN NEEDLES) 31G X 8 MM MISC, FOR USE WITH INSULIN PEN ONCE A DAY, Disp: 100 each, Rfl: 1 .  metFORMIN (GLUCOPHAGE-XR) 500 MG 24 hr tablet, Take 4 tablets (2,000 mg total) by mouth daily with breakfast., Disp: 360 tablet, Rfl: 1 .  Pitavastatin Calcium (LIVALO) 2 MG TABS, Take  1 tablet (2 mg total) by mouth at bedtime. Failed atorvastatin and pravastatin, Disp: 90 tablet, Rfl: 1 .  ranitidine (ZANTAC) 150 MG tablet, Take 1 tablet (150 mg total) by mouth 2 (two) times daily as needed for heartburn., Disp: 180 tablet, Rfl: 1 .  traZODone (DESYREL) 100 MG tablet, Take 1 tablet (100 mg total) by mouth at bedtime., Disp: 30 tablet, Rfl: 5 .  diclofenac (VOLTAREN) 50 MG EC tablet, Take 1 tablet (50 mg total) by mouth 2 (two) times daily. (Patient not taking: Reported on 03/17/2018), Disp: 60 tablet, Rfl: 0 .  Icosapent Ethyl (VASCEPA) 1 g CAPS, Take 2 capsules by mouth 2 (two) times  daily. I do not want patient on statin plus fibric acid derivative; please cover (Patient not taking: Reported on 09/29/2017), Disp: 120 capsule, Rfl: 5 .  meloxicam (MOBIC) 15 MG tablet, Take 1 tablet (15 mg total) by mouth daily. (Patient not taking: Reported on 03/17/2018), Disp: 60 tablet, Rfl: 0 .  methylPREDNISolone (MEDROL) 4 MG TBPK tablet, Use as directed (Patient not taking: Reported on 03/17/2018), Disp: 21 tablet, Rfl: 0 .  traMADol (ULTRAM) 50 MG tablet, Take 1 tablet (50 mg total) by mouth every 8 (eight) hours as needed. (Patient not taking: Reported on 03/17/2018), Disp: 30 tablet, Rfl: 0 .  varenicline (CHANTIX STARTING MONTH PAK) 0.5 MG X 11 & 1 MG X 42 tablet, Take one 0.5 mg tablet by mouth once daily for 3 days, then increase to one 0.5 mg tablet twice daily for 4 days, then increase to one 1 mg tablet twice daily. (Patient not taking: Reported on 03/17/2018), Disp: 53 tablet, Rfl: 0  Allergies  Allergen Reactions  . Atorvastatin Other (See Comments)    Arthralgia  . Percocet [Oxycodone-Acetaminophen] Nausea And Vomiting    ROS  Constitutional: Negative for fever or weight change.  Respiratory: Negative for cough and shortness of breath.   Cardiovascular: Negative for chest pain or palpitations.  Gastrointestinal: Negative for abdominal pain, no bowel changes.  Musculoskeletal: Negative for gait problem or joint swelling.  Skin: Negative for rash.  Neurological: Negative for dizziness or headache.  No other specific complaints in a complete review of systems (except as listed in HPI above).  Objective  Vitals:   03/17/18 0900  BP: 130/84  Pulse: (!) 101  Resp: 16  Temp: 98.5 F (36.9 C)  TempSrc: Oral  SpO2: 98%  Weight: 215 lb 14.4 oz (97.9 kg)  Height: 5\' 8"  (1.727 m)   Heart rate likely elevated secondary to pain level of 10/10. Body mass index is 32.83 kg/m.  Nursing Note and Vital Signs reviewed.  Physical Exam  Constitutional: Patient appears  well-developed and well-nourished. Obese No distress.  HEENT: head atraumatic, normocephalic Cardiovascular: Normal rate, regular rhythm, S1/S2 present.  No murmur or rub heard. No BLE edema. Pulmonary/Chest: Effort normal and breath sounds clear. No respiratory distress or retractions. Psychiatric: Patient has a normal mood and affect. behavior is normal. Judgment and thought content normal. Musculoskeletal: LEFT knee exhibits limited AROM that is painful for the patient; left knee extension and flexion are weaker against resistance compared to the right. No joint effusions are palpable at exam. No gross deformities. Crepitus present to left knee joint. Neurological: she is alert and oriented to person, place, and time. No cranial nerve deficit. Coordination, balance, strength, speech are normal; antalgic gait is present. Skin: Skin is warm and dry. No rash noted. No erythema. LEFT knee exhibits warmth over the joint.  No  results found for this or any previous visit (from the past 20 hour(s)).  Assessment & Plan  1. Acute pain of left knee - Ambulatory referral to Orthopedic Surgery  2. Primary osteoarthritis of left knee - Ambulatory referral to Orthopedic Surgery  - Advised to fill and take Voltaren when able to afford (pt does state she should be able to afford the medication by tomorrow - she states it is $3 at the pharmacy).

## 2018-04-07 ENCOUNTER — Other Ambulatory Visit: Payer: Self-pay | Admitting: Orthopedic Surgery

## 2018-04-07 DIAGNOSIS — M25562 Pain in left knee: Secondary | ICD-10-CM

## 2018-04-16 ENCOUNTER — Telehealth: Payer: Self-pay

## 2018-04-16 ENCOUNTER — Ambulatory Visit: Payer: Medicaid Other

## 2018-04-16 NOTE — Telephone Encounter (Signed)
MRI has been reapproved from 04/16/18 to 05/16/18 Auth # [id number]

## 2018-04-18 ENCOUNTER — Ambulatory Visit
Admission: RE | Admit: 2018-04-18 | Discharge: 2018-04-18 | Disposition: A | Discharge status: Home / Self Care | Payer: Medicaid Other | Source: Ambulatory Visit | Attending: Podiatry | Admitting: Podiatry

## 2018-04-18 ENCOUNTER — Ambulatory Visit
Admission: RE | Admit: 2018-04-18 | Discharge: 2018-04-18 | Disposition: A | Discharge status: Home / Self Care | Payer: Medicaid Other | Source: Ambulatory Visit | Attending: Orthopedic Surgery | Admitting: Orthopedic Surgery

## 2018-04-18 DIAGNOSIS — M7662 Achilles tendinitis, left leg: Secondary | ICD-10-CM | POA: Diagnosis present

## 2018-04-18 DIAGNOSIS — X58XXXA Exposure to other specified factors, initial encounter: Secondary | ICD-10-CM | POA: Insufficient documentation

## 2018-04-18 DIAGNOSIS — M25562 Pain in left knee: Secondary | ICD-10-CM | POA: Diagnosis present

## 2018-04-18 DIAGNOSIS — M1712 Unilateral primary osteoarthritis, left knee: Secondary | ICD-10-CM | POA: Diagnosis not present

## 2018-04-18 DIAGNOSIS — S83412A Sprain of medial collateral ligament of left knee, initial encounter: Secondary | ICD-10-CM | POA: Diagnosis not present

## 2018-04-18 DIAGNOSIS — M25462 Effusion, left knee: Secondary | ICD-10-CM | POA: Diagnosis not present

## 2018-05-14 LAB — HM DIABETES EYE EXAM

## 2018-07-21 ENCOUNTER — Other Ambulatory Visit: Payer: Self-pay | Admitting: Family Medicine

## 2018-10-01 ENCOUNTER — Other Ambulatory Visit: Payer: Self-pay | Admitting: Family Medicine

## 2018-10-06 ENCOUNTER — Other Ambulatory Visit: Payer: Self-pay | Admitting: Family Medicine

## 2018-10-06 MED ORDER — FAMOTIDINE 20 MG PO TABS: 20.0000 mg | ORAL_TABLET | Freq: Two times a day (BID) | ORAL | 1 refills | Status: DC | PRN

## 2018-10-07 ENCOUNTER — Other Ambulatory Visit: Payer: Self-pay | Admitting: Family Medicine

## 2018-11-04 ENCOUNTER — Other Ambulatory Visit: Payer: Self-pay | Admitting: Family Medicine

## 2018-11-04 DIAGNOSIS — Z794 Long term (current) use of insulin: Principal | ICD-10-CM

## 2018-11-04 DIAGNOSIS — E119 Type 2 diabetes mellitus without complications: Secondary | ICD-10-CM

## 2018-11-04 NOTE — Telephone Encounter (Signed)
Pt states has just ran out and is doing 30units.  She has sch an appt for tomorrow with you

## 2018-11-04 NOTE — Telephone Encounter (Signed)
So I saw this patient in January of 2019 (over a year ago) She was supposed to see me back 2-3 weeks later We have not seen her for her diabetes in over a year She needs an appointment first available I have no idea how much insulin she has been using lately, so please confirm and update med list if needed (she can't have been taking 30 units daily all this time because she would have run out of the medicine by now) This is really important to take care of her diabetes

## 2018-11-05 ENCOUNTER — Ambulatory Visit: Payer: Medicaid Other | Admitting: Family Medicine

## 2018-11-05 ENCOUNTER — Encounter: Payer: Self-pay | Admitting: Family Medicine

## 2018-11-05 ENCOUNTER — Telehealth: Payer: Self-pay | Admitting: Family Medicine

## 2018-11-05 VITALS — BP 130/72 | HR 97 | Temp 98.0°F | Resp 16 | Ht 68.0 in | Wt 210.4 lb

## 2018-11-05 DIAGNOSIS — E1169 Type 2 diabetes mellitus with other specified complication: Secondary | ICD-10-CM

## 2018-11-05 DIAGNOSIS — Z794 Long term (current) use of insulin: Secondary | ICD-10-CM | POA: Diagnosis not present

## 2018-11-05 DIAGNOSIS — Z1211 Encounter for screening for malignant neoplasm of colon: Secondary | ICD-10-CM

## 2018-11-05 DIAGNOSIS — E782 Mixed hyperlipidemia: Secondary | ICD-10-CM | POA: Diagnosis not present

## 2018-11-05 DIAGNOSIS — E559 Vitamin D deficiency, unspecified: Secondary | ICD-10-CM

## 2018-11-05 DIAGNOSIS — Z1239 Encounter for other screening for malignant neoplasm of breast: Secondary | ICD-10-CM | POA: Diagnosis not present

## 2018-11-05 DIAGNOSIS — R5383 Other fatigue: Secondary | ICD-10-CM

## 2018-11-05 DIAGNOSIS — Z5181 Encounter for therapeutic drug level monitoring: Secondary | ICD-10-CM

## 2018-11-05 DIAGNOSIS — E119 Type 2 diabetes mellitus without complications: Secondary | ICD-10-CM

## 2018-11-05 DIAGNOSIS — E669 Obesity, unspecified: Secondary | ICD-10-CM

## 2018-11-05 LAB — POCT GLYCOSYLATED HEMOGLOBIN (HGB A1C)
HBA1C, POC (PREDIABETIC RANGE): 13 % — AB (ref 5.7–6.4)
HEMOGLOBIN A1C: 13 % (ref 4.0–5.6)
HbA1c, POC (controlled diabetic range): 13 % — AB (ref 0.0–7.0)
Hemoglobin A1C: 13 % — AB (ref 4.0–5.6)

## 2018-11-05 LAB — GLUCOSE, POCT (MANUAL RESULT ENTRY): POC Glucose: 332 mg/dl — AB (ref 70–99)

## 2018-11-05 MED ORDER — ICOSAPENT ETHYL 1 G PO CAPS: 2.0000 | ORAL_CAPSULE | Freq: Two times a day (BID) | ORAL | 5 refills | Status: DC

## 2018-11-05 MED ORDER — LINAGLIPTIN 5 MG PO TABS: 5.0000 mg | ORAL_TABLET | Freq: Every day | ORAL | 5 refills | Status: DC

## 2018-11-05 MED ORDER — PIOGLITAZONE HCL 15 MG PO TABS: 15.0000 mg | ORAL_TABLET | Freq: Every day | ORAL | 5 refills | Status: DC

## 2018-11-05 MED ORDER — PITAVASTATIN CALCIUM 2 MG PO TABS: 1.0000 | ORAL_TABLET | Freq: Every day | ORAL | 1 refills | Status: DC

## 2018-11-05 NOTE — Telephone Encounter (Signed)
Copied from [place] [phone]. Topic: Quick Communication - Rx Refill/Question >> Nov 05, 2018  3:07 PM Sheppard Coil, [employer] wrote: Medication:  Icosapent Ethyl (VASCEPA) 1 g CAPS linagliptin (TRADJENTA) 5 MG TABS tablet Icosapent Ethyl (VASCEPA) 1 g CAPS  Joy with [facility] calling.  States that all three of these medications will require prior auth through [employer].

## 2018-11-05 NOTE — Patient Instructions (Addendum)
Start checking sugars once a day Start the new medicines for your diabetes Start back on the cholesterol medicine  Try to limit saturated fats in your diet (bologna, hot dogs, barbeque, cheeseburgers, hamburgers, steak, bacon, sausage, cheese, etc.) and get more fresh fruits, vegetables, and whole grains  Check out the information at familydoctor.org entitled "Nutrition for Weight Loss: What You Need to Know about Fad Diets" Try to lose between 1-2 pounds per week by taking in fewer calories and burning off more calories You can succeed by limiting portions, limiting foods dense in calories and fat, becoming more active, and drinking 8 glasses of water a day (64 ounces) Don't skip meals, especially breakfast, as skipping meals may alter your metabolism Do not use over-the-counter weight loss pills or gimmicks that claim rapid weight loss A healthy BMI (or body mass index) is between 18.5 and 24.9 You can calculate your ideal BMI at the [place] website ClubMonetize.fr   Obesity, Adult Obesity is the condition of having too much total body fat. Being overweight or obese means that your weight is greater than what is considered healthy for your body size. Obesity is determined by a measurement called BMI. BMI is an estimate of body fat and is calculated from height and weight. For adults, a BMI of 30 or higher is considered obese. Obesity can eventually lead to other health concerns and major illnesses, including:  Stroke.  Coronary artery disease (CAD).  Type 2 diabetes.  Some types of cancer, including cancers of the colon, breast, uterus, and gallbladder.  Osteoarthritis.  High blood pressure (hypertension).  High cholesterol.  Sleep apnea.  Gallbladder stones.  Infertility problems. What are the causes? The main cause of obesity is taking in (consuming) more calories than your body uses for energy. Other factors that contribute  to this condition may include:  Being born with genes that make you more likely to become obese.  Having a medical condition that causes obesity. These conditions include: ? Hypothyroidism. ? Polycystic ovarian syndrome (PCOS). ? Binge-eating disorder. ? Cushing syndrome.  Taking certain medicines, such as steroids, antidepressants, and seizure medicines.  Not being physically active (sedentary lifestyle).  Living where there are limited places to exercise safely or buy healthy foods.  Not getting enough sleep. What increases the risk? The following factors may increase your risk of this condition:  Having a family history of obesity.  Being a woman of African-American descent.  Being a man of Hispanic descent. What are the signs or symptoms? Having excessive body fat is the main symptom of this condition. How is this diagnosed? This condition may be diagnosed based on:  Your symptoms.  Your medical history.  A physical exam. Your health care provider may measure: ? Your BMI. If you are an adult with a BMI between 25 and less than 30, you are considered overweight. If you are an adult with a BMI of 30 or higher, you are considered obese. ? The distances around your hips and your waist (circumferences). These may be compared to each other to help diagnose your condition. ? Your skinfold thickness. Your health care provider may gently pinch a fold of your skin and measure it. How is this treated? Treatment for this condition often includes changing your lifestyle. Treatment may include some or all of the following:  Dietary changes. Work with your health care provider and a dietitian to set a weight-loss goal that is healthy and reasonable for you. Dietary changes may include eating: ? Smaller  portions. A portion size is the amount of a particular food that is healthy for you to eat at one time. This varies from person to person. ? Low-calorie or low-fat options. ? More  whole grains, fruits, and vegetables.  Regular physical activity. This may include aerobic activity (cardio) and strength training.  Medicine to help you lose weight. Your health care provider may prescribe medicine if you are unable to lose 1 pound a week after 6 weeks of eating more healthily and doing more physical activity.  Surgery. Surgical options may include gastric banding and gastric bypass. Surgery may be done if: ? Other treatments have not helped to improve your condition. ? You have a BMI of 40 or higher. ? You have life-threatening health problems related to obesity. Follow these instructions at home:  Eating and drinking   Follow recommendations from your health care provider about what you eat and drink. Your health care provider may advise you to: ? Limit fast foods, sweets, and processed snack foods. ? Choose low-fat options, such as low-fat milk instead of whole milk. ? Eat 5 or more servings of fruits or vegetables every day. ? Eat at home more often. This gives you more control over what you eat. ? Choose healthy foods when you eat out. ? Learn what a healthy portion size is. ? Keep low-fat snacks on hand. ? Avoid sugary drinks, such as soda, fruit juice, iced tea sweetened with sugar, and flavored milk. ? Eat a healthy breakfast.  Drink enough water to keep your urine clear or pale yellow.  Do not go without eating for long periods of time (do not fast) or follow a fad diet. Fasting and fad diets can be unhealthy and even dangerous. Physical Activity  Exercise regularly, as told by your health care provider. Ask your health care provider what types of exercise are safe for you and how often you should exercise.  Warm up and stretch before being active.  Cool down and stretch after being active.  Rest between periods of activity. Lifestyle  Limit the time that you spend in front of your TV, computer, or video game system.  Find ways to reward yourself  that do not involve food.  Limit alcohol intake to no more than 1 drink a day for nonpregnant women and 2 drinks a day for men. One drink equals 12 oz of beer, 5 oz of wine, or 1 oz of hard liquor. General instructions  Keep a weight loss journal to keep track of the food you eat and how much you exercise you get.  Take over-the-counter and prescription medicines only as told by your health care provider.  Take vitamins and supplements only as told by your health care provider.  Consider joining a support group. Your health care provider may be able to recommend a support group.  Keep all follow-up visits as told by your health care provider. This is important. Contact a health care provider if:  You are unable to meet your weight loss goal after 6 weeks of dietary and lifestyle changes. This information is not intended to replace advice given to you by your health care provider. Make sure you discuss any questions you have with your health care provider. Document Released: 10/03/2004 Document Revised: 01/29/2016 Document Reviewed: 06/14/2015 Elsevier Interactive Patient Education  2019 Else[employer] Inc.  Preventing Unhealthy [employer], Adult Staying at a healthy weight is important to your overall health. When fat builds up in your body, you may become  overweight or obese. Being overweight or obese increases your risk of developing certain health problems, such as heart disease, diabetes, sleeping problems, joint problems, and some types of cancer. Unhealthy weight gain is often the result of making unhealthy food choices or not getting enough exercise. You can make changes to your lifestyle to prevent obesity and stay as healthy as possible. What nutrition changes can be made?   Eat only as much as your body needs. To do this: ? Pay attention to signs that you are hungry or full. Stop eating as soon as you feel full. ? If you feel hungry, try drinking water first before eating. Drink  enough water so your urine is clear or pale yellow. ? Eat smaller portions. Pay attention to portion sizes when eating out. ? Look at serving sizes on food labels. Most foods contain more than one serving per container. ? Eat the recommended number of calories for your gender and activity level. For most active people, a daily total of 2,000 calories is appropriate. If you are trying to lose weight or are not very active, you may need to eat fewer calories. Talk with your health care provider or a diet and nutrition specialist (dietitian) about how many calories you need each day.  Choose healthy foods, such as: ? Fruits and vegetables. At each meal, try to fill at least half of your plate with fruits and vegetables. ? Whole grains, such as whole-wheat bread, brown rice, and quinoa. ? Lean meats, such as chicken or fish. ? Other healthy proteins, such as beans, eggs, or tofu. ? Healthy fats, such as nuts, seeds, fatty fish, and olive oil. ? Low-fat or fat-free dairy products.  Check food labels, and avoid food and drinks that: ? Are high in calories. ? Have added sugar. ? Are high in sodium. ? Have saturated fats or trans fats.  Cook foods in healthier ways, such as by baking, broiling, or grilling.  Make a meal plan for the week, and shop with a grocery list to help you stay on track with your purchases. Try to avoid going to the grocery store when you are hungry.  When grocery shopping, try to shop around the outside of the store first, where the fresh foods are. Doing this helps you to avoid prepackaged foods, which can be high in sugar, salt (sodium), and fat. What lifestyle changes can be made?   Exercise for 30 or more minutes on 5 or more days each week. Exercising may include brisk walking, yard work, biking, running, swimming, and team sports like basketball and soccer. Ask your health care provider which exercises are safe for you.  Do muscle-strengthening activities, such as  lifting weights or using resistance bands, on 2 or more days a week.  Do not use any products that contain nicotine or tobacco, such as cigarettes and e-cigarettes. If you need help quitting, ask your health care provider.  Limit alcohol intake to no more than 1 drink a day for nonpregnant women and 2 drinks a day for men. One drink equals 12 oz of beer, 5 oz of wine, or 1 oz of hard liquor.  Try to get 7-9 hours of sleep each night. What other changes can be made?  Keep a food and activity journal to keep track of: ? What you ate and how many calories you had. Remember to count the calories in sauces, dressings, and side dishes. ? Whether you were active, and what exercises you did. ? Your  calorie, weight, and activity goals.  Check your weight regularly. Track any changes. If you notice you have gained weight, make changes to your diet or activity routine.  Avoid taking weight-loss medicines or supplements. Talk to your health care provider before starting any new medicine or supplement.  Talk to your health care provider before trying any new diet or exercise plan. Why are these changes important? Eating healthy, staying active, and having healthy habits can help you to prevent obesity. Those changes also:  Help you manage stress and emotions.  Help you connect with friends and family.  Improve your self-esteem.  Improve your sleep.  Prevent long-term health problems. What can happen if changes are not made? Being obese or overweight can cause you to develop joint or bone problems, which can make it hard for you to stay active or do activities you enjoy. Being obese or overweight also puts stress on your heart and lungs and can lead to health problems like diabetes, heart disease, and some cancers. Where to find more information Talk with your health care provider or a dietitian about healthy eating and healthy lifestyle choices. You may also find information from:  [place]  Department of Agriculture, MyPlate: FormerBoss.no  American Heart Association: www.heart.org  Centers for Disease Control and Prevention: http://www.wolf.info/ Summary  Staying at a healthy weight is important to your overall health. It helps you to prevent certain diseases and health problems, such as heart disease, diabetes, joint problems, sleep disorders, and some types of cancer.  Being obese or overweight can cause you to develop joint or bone problems, which can make it hard for you to stay active or do activities you enjoy.  You can prevent unhealthy weight gain by eating a healthy diet, exercising regularly, not smoking, limiting alcohol, and getting enough sleep.  Talk with your health care provider or a dietitian for guidance about healthy eating and healthy lifestyle choices. This information is not intended to replace advice given to you by your health care provider. Make sure you discuss any questions you have with your health care provider. Document Released: 08/27/2016 Document Revised: 06/06/2017 Document Reviewed: 10/02/2016 Elsevier Interactive Patient Education  2019 [employer].  Diabetes Mellitus and Nutrition, Adult When you have diabetes (diabetes mellitus), it is very important to have healthy eating habits because your blood sugar (glucose) levels are greatly affected by what you eat and drink. Eating healthy foods in the appropriate amounts, at about the same times every day, can help you:  Control your blood glucose.  Lower your risk of heart disease.  Improve your blood pressure.  Reach or maintain a healthy weight. Every person with diabetes is different, and each person has different needs for a meal plan. Your health care provider may recommend that you work with a diet and nutrition specialist (dietitian) to make a meal plan that is best for you. Your meal plan may vary depending on factors such as:  The calories you need.  The medicines you  take.  Your weight.  Your blood glucose, blood pressure, and cholesterol levels.  Your activity level.  Other health conditions you have, such as heart or kidney disease. How do carbohydrates affect me? Carbohydrates, also called carbs, affect your blood glucose level more than any other type of food. Eating carbs naturally raises the amount of glucose in your blood. Carb counting is a method for keeping track of how many carbs you eat. Counting carbs is important to keep your blood glucose at a  healthy level, especially if you use insulin or take certain oral diabetes medicines. It is important to know how many carbs you can safely have in each meal. This is different for every person. Your dietitian can help you calculate how many carbs you should have at each meal and for each snack. Foods that contain carbs include:  Bread, cereal, rice, pasta, and crackers.  Potatoes and corn.  Peas, beans, and lentils.  Milk and yogurt.  Fruit and juice.  Desserts, such as cakes, cookies, ice cream, and candy. How does alcohol affect me? Alcohol can cause a sudden decrease in blood glucose (hypoglycemia), especially if you use insulin or take certain oral diabetes medicines. Hypoglycemia can be a life-threatening condition. Symptoms of hypoglycemia (sleepiness, dizziness, and confusion) are similar to symptoms of having too much alcohol. If your health care provider says that alcohol is safe for you, follow these guidelines:  Limit alcohol intake to no more than 1 drink per day for nonpregnant women and 2 drinks per day for men. One drink equals 12 oz of beer, 5 oz of wine, or 1 oz of hard liquor.  Do not drink on an empty stomach.  Keep yourself hydrated with water, diet soda, or unsweetened iced tea.  Keep in mind that regular soda, juice, and other mixers may contain a lot of sugar and must be counted as carbs. What are tips for following this plan?  Reading food labels  Start by  checking the serving size on the "Nutrition Facts" label of packaged foods and drinks. The amount of calories, carbs, fats, and other nutrients listed on the label is based on one serving of the item. Many items contain more than one serving per package.  Check the total grams (g) of carbs in one serving. You can calculate the number of servings of carbs in one serving by dividing the total carbs by 15. For example, if a food has 30 g of total carbs, it would be equal to 2 servings of carbs.  Check the number of grams (g) of saturated and trans fats in one serving. Choose foods that have low or no amount of these fats.  Check the number of milligrams (mg) of salt (sodium) in one serving. Most people should limit total sodium intake to less than 2,300 mg per day.  Always check the nutrition information of foods labeled as "low-fat" or "nonfat". These foods may be higher in added sugar or refined carbs and should be avoided.  Talk to your dietitian to identify your daily goals for nutrients listed on the label. Shopping  Avoid buying canned, premade, or processed foods. These foods tend to be high in fat, sodium, and added sugar.  Shop around the outside edge of the grocery store. This includes fresh fruits and vegetables, bulk grains, fresh meats, and fresh dairy. Cooking  Use low-heat cooking methods, such as baking, instead of high-heat cooking methods like deep frying.  Cook using healthy oils, such as olive, canola, or sunflower oil.  Avoid cooking with butter, cream, or high-fat meats. Meal planning  Eat meals and snacks regularly, preferably at the same times every day. Avoid going long periods of time without eating.  Eat foods high in fiber, such as fresh fruits, vegetables, beans, and whole grains. Talk to your dietitian about how many servings of carbs you can eat at each meal.  Eat 4-6 ounces (oz) of lean protein each day, such as lean meat, chicken, fish, eggs, or tofu. One oz  of lean protein is equal to: ? 1 oz of meat, chicken, or fish. ? 1 egg. ?  cup of tofu.  Eat some foods each day that contain healthy fats, such as avocado, nuts, seeds, and fish. Lifestyle  Check your blood glucose regularly.  Exercise regularly as told by your health care provider. This may include: ? 150 minutes of moderate-intensity or vigorous-intensity exercise each week. This could be brisk walking, biking, or water aerobics. ? Stretching and doing strength exercises, such as yoga or weightlifting, at least 2 times a week.  Take medicines as told by your health care provider.  Do not use any products that contain nicotine or tobacco, such as cigarettes and e-cigarettes. If you need help quitting, ask your health care provider.  Work with a Social worker or diabetes educator to identify strategies to manage stress and any emotional and social challenges. Questions to ask a health care provider  Do I need to meet with a diabetes educator?  Do I need to meet with a dietitian?  What number can I call if I have questions?  When are the best times to check my blood glucose? Where to find more information:  American Diabetes Association: diabetes.org  Academy of Nutrition and Dietetics: www.eatright.[employer] of Diabetes and Digestive and Kidney Diseases (NIH): [employer] Summary  A healthy meal plan will help you control your blood glucose and maintain a healthy lifestyle.  Working with a diet and nutrition specialist (dietitian) can help you make a meal plan that is best for you.  Keep in mind that carbohydrates (carbs) and alcohol have immediate effects on your blood glucose levels. It is important to count carbs and to use alcohol carefully. This information is not intended to replace advice given to you by your health care provider. Make sure you discuss any questions you have with your health care provider. Document Released: 05/23/2005 Document  Revised: 03/26/2017 Document Reviewed: 09/30/2016 Elsevier Interactive Patient Education  2019 [employer].

## 2018-11-05 NOTE — Progress Notes (Signed)
BP 130/72 (BP Location: Left Arm, Patient Position: Sitting, Cuff Size: Normal)   Pulse 97   Temp 98 F (36.7 C) (Oral)   Resp 16   Ht 5\' 8"  (1.727 m)   Wt 210 lb 6.4 oz (95.4 kg)   LMP 04/14/2015   SpO2 98%   BMI 31.99 kg/m    Subjective:    Patient ID: Faith Smith, female    DOB: [birth year], 58 y.o.   MRN: [id number]  HPI: Faith Smith is a 58 y.o. female  Chief Complaint  Patient presents with  . Follow-up    HPI Here for follow-up Type 2 diabetes She has not checked her sugars in a month She says she has been on insulin, she has been giving herself 30 units of insulin every night She does feel shaky at times She has been off of the metformin for a year; she does not like the metformin, "it runs me to the bathroom" She tries to limit pasta and rice Drinking regular Pepsi and Mountain Dew  High cholesterol; history of TG >500 Prescribed livalo and vascepa; I asked when she took it last: "in a minute" She was eating pork but she quit eating pork She does not eat hot dogs Burger every now and then No french fries  Energy level is poor; tired  Quit smoking 6 months ago; not a cigarette in 6 months  Still having issues with heartburn; her medicine was changed  She has been stressed out; daughter living with her now, her five children are there too, as well as his son and his girlfriend and two more kids, and another daughter left her oldest daughter with her; she has given them until the end of the month, she is setting boundaries now  She is truly fasting for labs today  Depression screen [facility] 2/9 11/05/2018 09/29/2017 02/26/2017 12/28/2015 04/14/2015  Decreased Interest 0 2 0 0 1  Down, Depressed, Hopeless 0 2 1 0 0  PHQ - 2 Score 0 4 1 0 1  Altered sleeping 0 1 - - -  Tired, decreased energy 3 2 - - -  Change in appetite 3 2 - - -  Feeling bad or failure about yourself  0 1 - - -  Trouble concentrating 0 0 - - -  Moving slowly or fidgety/restless 0 0 - -  -  Suicidal thoughts 0 0 - - -  PHQ-9 Score 6 10 - - -  Difficult doing work/chores Somewhat difficult - - - -   Fall Risk  11/05/2018 09/29/2017 02/26/2017 12/28/2015  Falls in the past year? 0 No No No  Number falls in past yr: 0 - - -  Injury with Fall? 0 - - -    Relevant past medical, surgical, family and social history reviewed Past Medical History:  Diagnosis Date  . Anxiety   . Arthritis   . Blood transfusion without reported diagnosis    previously  . Depression   . Diabetes mellitus without complication ([place])   . Elevated platelet count 01/19/2016  . GERD (gastroesophageal reflux disease)   . Hyperlipidemia   . Proptosis 01/19/2016   Past Surgical History:  Procedure Laterality Date  . BREAST BIOPSY Left 2015?   left  . [place]   Had 3  . CHOLECYSTECTOMY  1997   Family History  Problem Relation Age of Onset  . Hypertension Mother   . Cervical cancer Mother   . Depression  Brother   . Stroke Father   . Ulcers Father   . Diabetes Maternal Grandmother   . Brain cancer Paternal Grandmother   . Hypertension Paternal Grandfather    Social History   Tobacco Use  . Smoking status: Former Smoker    Packs/day: 0.50    Years: 20.00    Pack years: 10.00    Types: Cigarettes    Start date: 03/20/1997    Last attempt to quit: 04/09/2018    Years since quitting: 0.5  . Smokeless tobacco: Never Used  . Tobacco comment: patient states she is working on it now  Substance Use Topics  . Alcohol use: No    Alcohol/week: 0.0 standard drinks  . Drug use: No     Office Visit from 11/05/2018 in [facility]  AUDIT-C Score  0      Interim medical history since last visit reviewed. Allergies and medications reviewed  Review of Systems Per HPI unless specifically indicated above     Objective:    BP 130/72 (BP Location: Left Arm, Patient Position: Sitting, Cuff Size: Normal)   Pulse 97   Temp 98 F (36.7 C) (Oral)   Resp  16   Ht 5\' 8"  (1.727 m)   Wt 210 lb 6.4 oz (95.4 kg)   LMP 04/14/2015   SpO2 98%   BMI 31.99 kg/m   Wt Readings from Last 3 Encounters:  11/05/18 210 lb 6.4 oz (95.4 kg)  03/17/18 215 lb 14.4 oz (97.9 kg)  03/13/18 210 lb (95.3 kg)    Physical Exam Constitutional:      General: She is not in acute distress.    Appearance: She is well-developed. She is obese. She is not diaphoretic.  HENT:     Head: Normocephalic and atraumatic.  Eyes:     General: No scleral icterus. Neck:     Thyroid: No thyromegaly.  Cardiovascular:     Rate and Rhythm: Normal rate and regular rhythm.     Heart sounds: Normal heart sounds. No murmur.  Pulmonary:     Effort: Pulmonary effort is normal. No respiratory distress.     Breath sounds: Normal breath sounds. No wheezing.  Abdominal:     General: Bowel sounds are normal. There is no distension.     Palpations: Abdomen is soft.  Skin:    General: Skin is warm and dry.     Coloration: Skin is not pale.  Neurological:     Mental Status: She is alert.  Psychiatric:        Behavior: Behavior normal.        Thought Content: Thought content normal.        Judgment: Judgment normal.    Diabetic Foot Form - Detailed   Diabetic Foot Exam - detailed Diabetic Foot exam was performed with the following findings:  Yes 11/05/2018  2:33 PM  Visual Foot Exam completed.:  Yes  Pulse Foot Exam completed.:  Yes  Right Dorsalis Pedis:  Present Left Dorsalis Pedis:  Present  Sensory Foot Exam Completed.:  Yes Semmes-Weinstein Monofilament Test R Site 1-Great Toe:  Pos L Site 1-Great Toe:  Pos          Assessment & Plan:   Problem List Items Addressed This Visit      Endocrine   Diabetes mellitus type 2 in obese ([place]) - Primary    encourged weight loss; explained risk of complications with high W4X, obesity, high TG, etc; QUIT drinking regular sugary drinks;  limit pasta and rice      Relevant Medications   pioglitazone (ACTOS) 15 MG tablet   Other  Relevant Orders   Ambulatory referral to Endocrinology   Lipid panel (Completed)     Other   Fatigue   Relevant Orders   TSH (Completed)   Vitamin D deficiency   Relevant Orders   VITAMIN D 25 Hydroxy (Vit-D Deficiency, Fractures) (Completed)   Obesity (BMI 30.0-34.9)   Medication monitoring encounter   Relevant Orders   COMPLETE METABOLIC PANEL WITH GFR (Completed)   Hypercholesterolemia with hypertriglyceridemia    Check cholesterol today; start back on medicines      Relevant Orders   Lipid panel (Completed)    Other Visit Diagnoses    Screening for breast cancer       Relevant Orders   MM 3D SCREEN BREAST BILATERAL   Screen for colon cancer       Relevant Orders   Ambulatory referral to Gastroenterology   Controlled type 2 diabetes mellitus without complication, with long-term current use of insulin (HCC)       ** NOT accurate diagnosis, but entered for the A1c and glucose and I cannot remove **   Relevant Medications   pioglitazone (ACTOS) 15 MG tablet   Other Relevant Orders   POCT Glucose (CBG) (Completed)   POCT HgB A1C (Completed)   Urine Microalbumin w/creat. ratio       Follow up plan: Return in about 4 weeks (around 12/03/2018) for CPE with pap.  An after-visit summary was printed and given to the patient at [facility].  Please see the patient instructions which may contain other information and recommendations beyond what is mentioned above in the assessment and plan.  Meds ordered this encounter  Medications  . pioglitazone (ACTOS) 15 MG tablet    Sig: Take 1 tablet (15 mg total) by mouth daily.    Dispense:  30 tablet    Refill:  5  . DISCONTD: Icosapent Ethyl (VASCEPA) 1 g CAPS    Sig: Take 2 capsules (2 g total) by mouth 2 (two) times daily. I do not want patient on statin plus fibric acid derivative; please cover    Dispense:  120 capsule    Refill:  5  . DISCONTD: Pitavastatin Calcium (LIVALO) 2 MG TABS    Sig: Take 1 tablet (2 mg total) by  mouth at bedtime. Failed atorvastatin and pravastatin    Dispense:  90 tablet    Refill:  1  . DISCONTD: linagliptin (TRADJENTA) 5 MG TABS tablet    Sig: Take 1 tablet (5 mg total) by mouth daily.    Dispense:  30 tablet    Refill:  5    Orders Placed This Encounter  Procedures  . MM 3D SCREEN BREAST BILATERAL  . Urine Microalbumin w/creat. ratio  . Lipid panel  . COMPLETE METABOLIC PANEL WITH GFR  . VITAMIN D 25 Hydroxy (Vit-D Deficiency, Fractures)  . TSH  . Microalbumin / creatinine urine ratio  . Ambulatory referral to Gastroenterology  . Ambulatory referral to Endocrinology  . POCT Glucose (CBG)  . POCT HgB A1C

## 2018-11-05 NOTE — Telephone Encounter (Signed)
Can you help with these please? :)

## 2018-11-05 NOTE — Assessment & Plan Note (Signed)
Check cholesterol today; start back on medicines

## 2018-11-05 NOTE — Assessment & Plan Note (Signed)
encourged weight loss; explained risk of complications with high X4V, obesity, high TG, etc; QUIT drinking regular sugary drinks; limit pasta and rice

## 2018-11-06 ENCOUNTER — Other Ambulatory Visit: Payer: Self-pay | Admitting: Family Medicine

## 2018-11-06 ENCOUNTER — Telehealth: Payer: Self-pay | Admitting: Family Medicine

## 2018-11-06 ENCOUNTER — Telehealth: Payer: Self-pay

## 2018-11-06 ENCOUNTER — Other Ambulatory Visit: Payer: Self-pay

## 2018-11-06 DIAGNOSIS — Z1211 Encounter for screening for malignant neoplasm of colon: Secondary | ICD-10-CM

## 2018-11-06 LAB — COMPLETE METABOLIC PANEL WITH GFR
AG RATIO: 1.3 (calc) (ref 1.0–2.5)
ALT: 10 U/L (ref 6–29)
AST: 12 U/L (ref 10–35)
Albumin: 3.9 g/dL (ref 3.6–5.1)
Alkaline phosphatase (APISO): 114 U/L (ref 37–153)
BUN: 10 mg/dL (ref 7–25)
CO2: 29 mmol/L (ref 20–32)
Calcium: 10.1 mg/dL (ref 8.6–10.4)
Chloride: 100 mmol/L (ref 98–110)
Creat: 0.81 mg/dL (ref 0.50–1.05)
GFR, EST AFRICAN AMERICAN: 92 mL/min/{1.73_m2} (ref >=60)
GFR, Est Non African American: 79 mL/min/{1.73_m2} (ref >=60)
GLUCOSE: 325 mg/dL — AB (ref 65–99)
Globulin: 3 g/dL (calc) (ref 1.9–3.7)
Potassium: 3.5 mmol/L (ref 3.5–5.3)
Sodium: 136 mmol/L (ref 135–146)
TOTAL PROTEIN: 6.9 g/dL (ref 6.1–8.1)
Total Bilirubin: 0.3 mg/dL (ref 0.2–1.2)

## 2018-11-06 LAB — LIPID PANEL
Cholesterol: 242 mg/dL — ABNORMAL HIGH (ref <200)
HDL: 34 mg/dL — AB (ref >=50)
NON-HDL CHOLESTEROL (CALC): 208 mg/dL — AB (ref <130)
Total CHOL/HDL Ratio: 7.1 (calc) — ABNORMAL HIGH (ref <5.0)
Triglycerides: 431 mg/dL — ABNORMAL HIGH (ref <150)

## 2018-11-06 LAB — VITAMIN D 25 HYDROXY (VIT D DEFICIENCY, FRACTURES): VIT D 25 HYDROXY: 8 ng/mL — AB (ref 30–100)

## 2018-11-06 LAB — MICROALBUMIN / CREATININE URINE RATIO
Creatinine, Urine: 121 mg/dL (ref 20–275)
Microalb Creat Ratio: 21 mcg/mg creat (ref <30)
Microalb, Ur: 2.6 mg/dL

## 2018-11-06 LAB — TSH: TSH: 1.08 m[IU]/L (ref 0.40–4.50)

## 2018-11-06 MED ORDER — VITAMIN D (ERGOCALCIFEROL) 1.25 MG (50000 UNIT) PO CAPS: 50000.0000 [IU] | ORAL_CAPSULE | ORAL | 2 refills | Status: DC

## 2018-11-06 NOTE — Progress Notes (Signed)
Faith Smith, please let the patient know that her vitamin D level is only 8. That is very low. She will benefit from prescription vitamin D 50,000 iu once a week for 12 weeks. I'll send that in. Once she finishes with the prescriptions, she can take 1,000 iu vitamin D3 once a day after that. Thank you

## 2018-11-06 NOTE — Telephone Encounter (Signed)
Patient has medicaid and they have rejected the following meds: Tradjenta Livalo Vascepa  I will look to see what appropriate covered alternatives there are before trying to appeal or do a prior [place]

## 2018-11-06 NOTE — Progress Notes (Signed)
Rx for vit D once a week for 12 weeks, then 1,000 vit D3 once a day OTC

## 2018-11-06 NOTE — Telephone Encounter (Signed)
PA has been done online via [employer]. Waiting for a response.

## 2018-11-06 NOTE — Progress Notes (Signed)
Faith Smith, please let the patient know that her cholesterol is out of control, so we're glad she's getting back on the two medicines to help that She is not spilling excessive protein through her kidneys, so that is good news Thank you

## 2018-11-06 NOTE — Telephone Encounter (Signed)
Gastroenterology Pre-Procedure Review  Request Date: 11/18/18 Requesting Physician: Dr. Bonna Gains  PATIENT REVIEW QUESTIONS: The patient responded to the following health history questions as indicated:    1. Are you having any GI issues? yes (Reflux takes Pepcid to manage symptoms) 2. Do you have a personal history of Polyps? no 3. Do you have a family history of Colon Cancer or Polyps? no 4. Diabetes Mellitus? yes (Patient takes Insulin for Diabetes Control) 5. Joint replacements in the past 12 months?no 6. Major health problems in the past 3 months?no 7. Any artificial heart valves, MVP, or defibrillator?no    MEDICATIONS & ALLERGIES:    Patient reports the following regarding taking any anticoagulation/antiplatelet therapy:   Plavix, Coumadin, Eliquis, Xarelto, Lovenox, Pradaxa, Brilinta, or Effient? no Aspirin? no  Patient confirms/reports the following medications:  Current Outpatient Medications  Medication Sig Dispense Refill  . ACCU-CHEK FASTCLIX LANCETS MISC 1 Device by Does not apply route 3 (three) times daily. Dx E11.65, LON 99 months 100 each 5  . Blood Gluc Meter Disp-Strips (BLOOD GLUCOSE METER DISPOSABLE) DEVI 1 each by Does not apply route 3 (three) times daily before meals. 100 each 3  . famotidine (PEPCID) 20 MG tablet Take 1 tablet (20 mg total) by mouth 2 (two) times daily as needed for heartburn or indigestion. 180 tablet 1  . glucose blood test strip Check sugars 3x a day; LON 99 months, Dx E11.65 100 each 5  . Icosapent Ethyl (VASCEPA) 1 g CAPS Take 2 capsules (2 g total) by mouth 2 (two) times daily. I do not want patient on statin plus fibric acid derivative; please cover 120 capsule 5  . Insulin Pen Needle (ULTICARE SHORT PEN NEEDLES) 31G X 8 MM MISC FOR USE WITH INSULIN PEN ONCE A DAY 100 each 1  . LANTUS SOLOSTAR 100 UNIT/ML Solostar Pen INJECT 30 UNITS INTO THE SKIN AT BEDTIME 15 mL 0  . linagliptin (TRADJENTA) 5 MG TABS tablet Take 1 tablet (5 mg total)  by mouth daily. 30 tablet 5  . pioglitazone (ACTOS) 15 MG tablet Take 1 tablet (15 mg total) by mouth daily. 30 tablet 5  . Pitavastatin Calcium (LIVALO) 2 MG TABS Take 1 tablet (2 mg total) by mouth at bedtime. Failed atorvastatin and pravastatin 90 tablet 1  . traZODone (DESYREL) 100 MG tablet Take 1 tablet (100 mg total) by mouth at bedtime as needed. 30 tablet 5   No current facility-administered medications for this visit.     Patient confirms/reports the following allergies:  Allergies  Allergen Reactions  . Atorvastatin Other (See Comments)    Arthralgia  . Percocet [Oxycodone-Acetaminophen] Nausea And Vomiting    No orders of the defined types were placed in this encounter.   AUTHORIZATION INFORMATION Primary Insurance: 1D#: Group #:  Secondary Insurance: 1D#: Group #:  SCHEDULE INFORMATION: Date: 11/18/18  Time: TBD Location:[facility]

## 2018-11-10 MED ORDER — PRAVASTATIN SODIUM 20 MG PO TABS: 20.0000 mg | ORAL_TABLET | Freq: Every day | ORAL | 2 refills | Status: DC

## 2018-11-10 MED ORDER — OMEGA-3-ACID ETHYL ESTERS 1 G PO CAPS: 2.0000 g | ORAL_CAPSULE | Freq: Two times a day (BID) | ORAL | 11 refills | Status: DC

## 2018-11-10 MED ORDER — SAXAGLIPTIN HCL 2.5 MG PO TABS: 2.5000 mg | ORAL_TABLET | Freq: Every day | ORAL | 5 refills | Status: DC

## 2018-11-10 NOTE — Telephone Encounter (Signed)
New Rxs sent to pharmacy Please let patient know to pick up new meds (check with pharmacy first to make sure they are ready) There should be two for cholesterol and one for diabetes

## 2018-11-10 NOTE — Telephone Encounter (Signed)
Patient notified of new medications sent to her pharmacy and they are ready for her to pick up.

## 2018-11-16 ENCOUNTER — Telehealth: Payer: Self-pay

## 2018-11-16 ENCOUNTER — Emergency Department
Admission: EM | Admit: 2018-11-16 | Discharge: 2018-11-16 | Disposition: A | Discharge status: Home / Self Care | Payer: Medicaid Other | Attending: Emergency Medicine | Admitting: Emergency Medicine

## 2018-11-16 ENCOUNTER — Encounter: Payer: Self-pay | Admitting: Emergency Medicine

## 2018-11-16 ENCOUNTER — Other Ambulatory Visit: Payer: Self-pay

## 2018-11-16 DIAGNOSIS — E119 Type 2 diabetes mellitus without complications: Secondary | ICD-10-CM | POA: Insufficient documentation

## 2018-11-16 DIAGNOSIS — J00 Acute nasopharyngitis [common cold]: Secondary | ICD-10-CM

## 2018-11-16 DIAGNOSIS — Z79899 Other long term (current) drug therapy: Secondary | ICD-10-CM | POA: Diagnosis not present

## 2018-11-16 DIAGNOSIS — B9789 Other viral agents as the cause of diseases classified elsewhere: Secondary | ICD-10-CM

## 2018-11-16 DIAGNOSIS — Z794 Long term (current) use of insulin: Secondary | ICD-10-CM | POA: Insufficient documentation

## 2018-11-16 DIAGNOSIS — F17210 Nicotine dependence, cigarettes, uncomplicated: Secondary | ICD-10-CM | POA: Insufficient documentation

## 2018-11-16 DIAGNOSIS — Y9289 Other specified places as the place of occurrence of the external cause: Secondary | ICD-10-CM | POA: Insufficient documentation

## 2018-11-16 DIAGNOSIS — J069 Acute upper respiratory infection, unspecified: Secondary | ICD-10-CM | POA: Diagnosis not present

## 2018-11-16 DIAGNOSIS — R05 Cough: Secondary | ICD-10-CM | POA: Diagnosis present

## 2018-11-16 MED ORDER — ALBUTEROL SULFATE HFA 108 (90 BASE) MCG/ACT IN AERS: 2.0000 | INHALATION_SPRAY | Freq: Four times a day (QID) | RESPIRATORY_TRACT | 0 refills | Status: DC | PRN

## 2018-11-16 MED ORDER — FLUTICASONE PROPIONATE 50 MCG/ACT NA SUSP: 2.0000 | Freq: Every day | NASAL | 0 refills | Status: DC

## 2018-11-16 MED ORDER — BENZONATATE 100 MG PO CAPS: ORAL_CAPSULE | ORAL | 0 refills | Status: DC

## 2018-11-16 NOTE — ED Provider Notes (Signed)
[facility] Emergency Department Provider Note ____________________________________________  Time seen: 2004  I have reviewed the triage vital signs and the nursing notes.  HISTORY  Chief Complaint  Cough  HPI Faith Smith is a 58 y.o. female presents herself to the ED for evaluation of a "bad cold" since Saturday.  Patient reports intermittent nonproductive cough, sinus drainage, and runny nose.  She denies any interim fevers, chills, or sweats.  Reports mild cold symptoms and her grandchildren, but denies any recent travel or other exposures.  Past Medical History:  Diagnosis Date  . Anxiety   . Arthritis   . Blood transfusion without reported diagnosis    previously  . Depression   . Diabetes mellitus without complication ([place])   . Elevated platelet count 01/19/2016  . GERD (gastroesophageal reflux disease)   . Hyperlipidemia   . Proptosis 01/19/2016    Patient Active Problem List   Diagnosis Date Noted  . Obesity (BMI 30.0-34.9) 03/27/2017  . Fatigue 02/26/2017  . Medication monitoring encounter 02/26/2017  . Achilles tendonitis, bilateral 01/19/2016  . Proptosis 01/19/2016  . Need for hepatitis C screening test 01/19/2016  . Stool incontinence 12/28/2015  . Colon cancer screening 12/28/2015  . Right-sided back pain 12/28/2015  . Osteoarthrosis multiple sites, not specified as generalized 04/14/2015  . Annual physical exam 04/14/2015  . Breast mass, left 04/14/2015  . Anxiety and depression 02/20/2015  . Diabetes mellitus type 2 in obese ([place]) 02/20/2015  . GERD (gastroesophageal reflux disease) 02/20/2015  . Hypercholesterolemia with hypertriglyceridemia 02/20/2015  . Arthralgia of multiple joints 02/20/2015  . Vitamin D deficiency 02/20/2015  . Nausea without vomiting 02/20/2015  . Insomnia 02/20/2015  . Diabetes ([place]) 03/15/2014  . Abdominal pain 12/15/2012    Past Surgical History:  Procedure Laterality Date  . BREAST BIOPSY Left  2015?   left  . [place]   Had 3  . CHOLECYSTECTOMY  1997    Prior to Admission medications   Medication Sig Start Date End Date Taking? Authorizing Provider  ACCU-CHEK FASTCLIX LANCETS MISC 1 Device by Does not apply route 3 (three) times daily. Dx E11.65, LON 99 months 09/29/17   Arnetha Courser, MD  albuterol (PROVENTIL HFA;VENTOLIN HFA) 108 (90 Base) MCG/ACT inhaler Inhale 2 puffs into the lungs every 6 (six) hours as needed for wheezing or shortness of breath. 11/16/18   Zell Doucette, Dannielle Karvonen, PA-C  benzonatate (TESSALON PERLES) 100 MG capsule Take 1-2 tabs TID prn cough 11/16/18   Ty Oshima, Dannielle Karvonen, PA-C  Blood Gluc Meter Disp-Strips (BLOOD GLUCOSE METER DISPOSABLE) DEVI 1 each by Does not apply route 3 (three) times daily before meals. 12/28/15   Arnetha Courser, MD  famotidine (PEPCID) 20 MG tablet Take 1 tablet (20 mg total) by mouth 2 (two) times daily as needed for heartburn or indigestion. 10/06/18   Arnetha Courser, MD  fluticasone (FLONASE) 50 MCG/ACT nasal spray Place 2 sprays into both nostrils daily. 11/16/18   Bernardina Cacho, Dannielle Karvonen, PA-C  glucose blood test strip Check sugars 3x a day; LON 99 months, Dx E11.65 09/29/17   Lada, Satira Anis, MD  Insulin Pen Needle (ULTICARE SHORT PEN NEEDLES) 31G X 8 MM MISC FOR USE WITH INSULIN PEN ONCE A DAY 09/29/17   Lada, Satira Anis, MD  LANTUS SOLOSTAR 100 UNIT/ML Solostar Pen INJECT 30 UNITS INTO THE SKIN AT BEDTIME 11/04/18   Lada, Satira Anis, MD  omega-3 acid ethyl esters (LOVAZA) 1  g capsule Take 2 capsules (2 g total) by mouth 2 (two) times daily. 11/10/18   Lada, Satira Anis, MD  pioglitazone (ACTOS) 15 MG tablet Take 1 tablet (15 mg total) by mouth daily. 11/05/18   Arnetha Courser, MD  pravastatin (PRAVACHOL) 20 MG tablet Take 1 tablet (20 mg total) by mouth at bedtime. For cholesterol 11/10/18   Lada, Satira Anis, MD  saxagliptin HCl (ONGLYZA) 2.5 MG TABS tablet Take 1 tablet (2.5 mg total) by mouth daily. 11/10/18   Arnetha Courser, MD  traZODone (DESYREL) 100 MG tablet Take 1 tablet (100 mg total) by mouth at bedtime as needed. 07/21/18   Arnetha Courser, MD  Vitamin D, Ergocalciferol, (DRISDOL) 1.25 MG (50000 UT) CAPS capsule Take 1 capsule (50,000 Units total) by mouth every 7 (seven) days. 11/06/18   Arnetha Courser, MD    Allergies Atorvastatin and Percocet [oxycodone-acetaminophen]  Family History  Problem Relation Age of Onset  . Hypertension Mother   . Cervical cancer Mother   . Depression Brother   . Stroke Father   . Ulcers Father   . Diabetes Maternal Grandmother   . Brain cancer Paternal Grandmother   . Hypertension Paternal Grandfather     Social History Social History   Tobacco Use  . Smoking status: Current Every Day Smoker    Packs/day: 0.50    Years: 20.00    Pack years: 10.00    Types: Cigarettes    Start date: 03/20/1997    Last attempt to quit: 04/09/2018    Years since quitting: 0.6  . Smokeless tobacco: Never Used  . Tobacco comment: patient states she is working on it now  Substance Use Topics  . Alcohol use: No    Alcohol/week: 0.0 standard drinks  . Drug use: No    Review of Systems  Constitutional: Negative for fever. Eyes: Negative for visual changes. ENT: Positive for sore throat.  Reports runny nose. Cardiovascular: Negative for chest pain. Respiratory: Negative for shortness of breath.  Reports intermittent nonproductive cough. Gastrointestinal: Negative for abdominal pain, vomiting and diarrhea. Genitourinary: Negative for dysuria. Musculoskeletal: Negative for back pain. Skin: Negative for rash. Neurological: Negative for headaches, focal weakness or numbness. ____________________________________________  PHYSICAL EXAM:  VITAL SIGNS: ED Triage Vitals  Enc Vitals Group     BP 11/16/18 1914 (!) 158/55     Pulse Rate 11/16/18 1914 83     Resp 11/16/18 1914 18     Temp 11/16/18 1914 98.8 F (37.1 C)     Temp Source 11/16/18 1914 Oral     SpO2  11/16/18 1914 98 %     Weight 11/16/18 1914 210 lb (95.3 kg)     Height 11/16/18 1914 5\' 8"  (1.727 m)     Head Circumference --      Peak Flow --      Pain Score 11/16/18 1913 0     Pain Loc --      Pain Edu? --      Excl. in [place]? --     Constitutional: Alert and oriented. Well appearing and in no distress. Head: Normocephalic and atraumatic. Eyes: Conjunctivae are normal. Normal extraocular movements Ears: Canals clear. TMs intact bilaterally. Nose: No congestion/rhinorrhea/epistaxis. Mouth/Throat: Mucous membranes are moist.  Uvula is midline and tonsils are flat.  No oropharyngeal lesions appreciated. Neck: Supple. No thyromegaly. Hematological/Lymphatic/Immunological: No cervical lymphadenopathy. Cardiovascular: Normal rate, regular rhythm. Normal distal pulses. Respiratory: Normal respiratory effort. No wheezes/rales/rhonchi. ____________________________________________  PROCEDURES  Procedures  ____________________________________________  INITIAL IMPRESSION / ASSESSMENT AND PLAN / ED COURSE  Patient with ED evaluation of 3-day complaint of intermittent cough with nasal drainage and sinus pressure.  Patient has not had any interim fevers, chills, or sweats.  Clinical picture is consistent with a likely rhinitis and postnasal drip.  Patient has no signs of acute respiratory distress, or dehydration.  Her vital signs are stable without signs of hypoxia or toxicity.  Patient will be treated with prescriptions for albuterol, Flonase, Tessalon Perles, and she is encouraged to take over-the-counter cough medicine.  She will follow-up with her primary provider or return to the ED as needed. ____________________________________________  FINAL CLINICAL IMPRESSION(S) / ED DIAGNOSES  Final diagnoses:  Viral URI with cough  Acute rhinitis      Elide Stalzer, Dannielle Karvonen, PA-C 11/16/18 2037    Nance Pear, MD 11/16/18 2141

## 2018-11-16 NOTE — ED Triage Notes (Signed)
Patient ambulatory to triage with steady gait, without difficulty or distress noted, mask in place; pt reports "bad cold" since Saturday; reports nonprod cough, sinus drainage; denies fever

## 2018-11-16 NOTE — Telephone Encounter (Signed)
Patient LVM for me to call her back to reschedule her colonoscopy.  I returned the call and left message with patients husband to ask his wife to call me back to change the date on her colonoscopy from 11/18/18.  Will await call back to reschedule.  Thanks [employer]

## 2018-11-16 NOTE — Discharge Instructions (Signed)
Your symptoms are consistent with sinus drainage and nasal congestion. You should take the prescription meds as directed. Start an OTC allergy medicine as discussed. You may also take OTC Delsym for additional cough relief. Follow-up with Dr. Sanda Klein or return as needed.

## 2018-11-18 ENCOUNTER — Ambulatory Visit: Admission: RE | Admit: 2018-11-18 | Payer: Medicaid Other | Source: Home / Self Care | Admitting: Gastroenterology

## 2018-11-18 ENCOUNTER — Encounter: Admission: RE | Payer: Self-pay | Source: Home / Self Care

## 2018-11-18 ENCOUNTER — Other Ambulatory Visit: Payer: Self-pay | Admitting: Family Medicine

## 2018-11-18 SURGERY — COLONOSCOPY WITH PROPOFOL
Anesthesia: General

## 2018-11-18 MED ORDER — SITAGLIPTIN PHOSPHATE 100 MG PO TABS: 100.0000 mg | ORAL_TABLET | Freq: Every day | ORAL | 5 refills | Status: DC

## 2018-11-18 NOTE — Progress Notes (Unsigned)
Pharmacy rejection note for Onglyza Per Medicaid, Januvia is PREFERRED Please initiate or check on prior auth if needed Please also initiate or check on prior auth for omega-3 acid ethyl esters (Lovaza) I do NOT want her to take gemfibrozil or fenofibrate (their preferred options) because she is also on a statin; concommittant administration of a statin with either of those may increase risk of statin-induced myopathy

## 2018-11-19 ENCOUNTER — Telehealth: Payer: Self-pay

## 2018-11-19 NOTE — Telephone Encounter (Signed)
Unable to document on prior note, but latish will work on prior [place] for medication through [employer] portal

## 2018-11-23 ENCOUNTER — Telehealth: Payer: Self-pay | Admitting: Family Medicine

## 2018-11-23 NOTE — Telephone Encounter (Signed)
Copied from [place] [id number]. Topic: Quick Communication - See Telephone Encounter >> Nov 23, 2018  5:45 PM Blase Mess A wrote: CRM for notification. See Telephone encounter for: 11/23/18.  Cory from [employer] [employer] is calling regarding a PA for Vascepa it was faxed on 2/2/720.  Tommi Rumps will refax. It will be a call in form.

## 2018-11-24 NOTE — Telephone Encounter (Signed)
Paper info on [employer]

## 2018-12-04 ENCOUNTER — Encounter: Payer: Medicaid Other | Admitting: Family Medicine

## 2019-01-01 ENCOUNTER — Other Ambulatory Visit: Payer: Self-pay | Admitting: Family Medicine

## 2019-01-01 MED ORDER — ICOSAPENT ETHYL 1 G PO CAPS: ORAL_CAPSULE | ORAL | 11 refills | Status: DC

## 2019-01-01 NOTE — Progress Notes (Signed)
Lovaza not covered Will try Vascepa Paperwork already filled out

## 2019-01-05 ENCOUNTER — Other Ambulatory Visit: Payer: Self-pay | Admitting: Family Medicine

## 2019-01-05 DIAGNOSIS — Z794 Long term (current) use of insulin: Principal | ICD-10-CM

## 2019-01-05 DIAGNOSIS — E119 Type 2 diabetes mellitus without complications: Secondary | ICD-10-CM

## 2019-03-01 ENCOUNTER — Encounter: Payer: Self-pay | Admitting: Family Medicine

## 2019-04-09 ENCOUNTER — Other Ambulatory Visit: Payer: Self-pay

## 2019-04-09 ENCOUNTER — Encounter: Payer: Self-pay | Admitting: Nurse Practitioner

## 2019-04-09 ENCOUNTER — Ambulatory Visit (INDEPENDENT_AMBULATORY_CARE_PROVIDER_SITE_OTHER): Payer: Medicaid Other | Admitting: Nurse Practitioner

## 2019-04-09 DIAGNOSIS — E782 Mixed hyperlipidemia: Secondary | ICD-10-CM

## 2019-04-09 DIAGNOSIS — J4520 Mild intermittent asthma, uncomplicated: Secondary | ICD-10-CM

## 2019-04-09 DIAGNOSIS — E1165 Type 2 diabetes mellitus with hyperglycemia: Secondary | ICD-10-CM | POA: Diagnosis not present

## 2019-04-09 DIAGNOSIS — Z5181 Encounter for therapeutic drug level monitoring: Secondary | ICD-10-CM | POA: Diagnosis not present

## 2019-04-09 DIAGNOSIS — J45909 Unspecified asthma, uncomplicated: Secondary | ICD-10-CM

## 2019-04-09 DIAGNOSIS — G4700 Insomnia, unspecified: Secondary | ICD-10-CM

## 2019-04-09 DIAGNOSIS — Z794 Long term (current) use of insulin: Secondary | ICD-10-CM

## 2019-04-09 DIAGNOSIS — K219 Gastro-esophageal reflux disease without esophagitis: Secondary | ICD-10-CM | POA: Diagnosis not present

## 2019-04-09 HISTORY — DX: Unspecified asthma, uncomplicated: J45.909

## 2019-04-09 MED ORDER — FAMOTIDINE 20 MG PO TABS: 20.0000 mg | ORAL_TABLET | Freq: Two times a day (BID) | ORAL | 1 refills | Status: DC | PRN

## 2019-04-09 MED ORDER — ALBUTEROL SULFATE HFA 108 (90 BASE) MCG/ACT IN AERS: 2.0000 | INHALATION_SPRAY | Freq: Four times a day (QID) | RESPIRATORY_TRACT | 0 refills | Status: DC | PRN

## 2019-04-09 MED ORDER — LANTUS SOLOSTAR 100 UNIT/ML ~~LOC~~ SOPN: 30.0000 [IU] | PEN_INJECTOR | Freq: Every day | SUBCUTANEOUS | 0 refills | Status: DC

## 2019-04-09 MED ORDER — VASCEPA 1 G PO CAPS: ORAL_CAPSULE | ORAL | 11 refills | Status: DC

## 2019-04-09 MED ORDER — LOSARTAN POTASSIUM 25 MG PO TABS: 12.5000 mg | ORAL_TABLET | Freq: Every day | ORAL | 0 refills | Status: DC

## 2019-04-09 MED ORDER — TRAZODONE HCL 100 MG PO TABS: 100.0000 mg | ORAL_TABLET | Freq: Every evening | ORAL | 1 refills | Status: DC | PRN

## 2019-04-09 NOTE — Progress Notes (Signed)
Virtual Visit via Telephone Note  I connected with Faith Smith on 04/09/19 at 11:20 AM EDT by telephone and verified that I am speaking with the correct person using two identifiers.   Staff discussed the limitations, risks, security and privacy concerns of performing an evaluation and management service by telephone and the availability of in person appointments. Staff also discussed with the patient that there may be a patient responsible charge related to this service. The patient expressed understanding and agreed to proceed.  Patients location: home My location: work office  Other people in meeting: husband- Herbie [place] Cea    HPI Diabetes Mellitus Patient is rx actos 15mg  a day, [place] 100mg  day, lantus 30 units daily. Takes medications as prescribed with no missed doses a month. She was unable to tolerate metformin due to diarrhea.  Checks blood sugars occassionally.  Last time she checked her CBG fasting was 115. She is on a statin. Sees eye doctor annually, last seen around 06/2018- noted to have glaucoma Denies polyphagia, polydipsia, polyuria.  Lab Results  Component Value Date   HGBA1C 13.0 (A) 11/05/2018   HGBA1C 13.0 11/05/2018   HGBA1C 13.0 (A) 11/05/2018   HGBA1C 13.0 (A) 11/05/2018   Hyperlipidemia Patient rx pravastatin 20mg  at night and vascepa 2 capsules BID. States she is taking the medication every other day  Diet: eats vegetables every day, does not eat pork, eats beef 3 times a week.  Denies myalgias Lab Results  Component Value Date   CHOL 242 (H) 11/05/2018   HDL 34 (L) 11/05/2018   [place]  11/05/2018     Comment:     . LDL cholesterol not calculated. Triglyceride levels greater than 400 mg/dL invalidate calculated LDL results. . Reference range: <100 . Desirable range <100 mg/dL for primary prevention;   <70 mg/dL for patients with CHD or diabetic patients  with > or = 2 CHD risk factors. Marland Kitchen LDL-C is now calculated using the Martin-Hopkins   calculation, which is a validated novel method providing  better accuracy than the Friedewald equation in the  estimation of LDL-C.  Cresenciano Genre et al. Annamaria Helling. 2831;517(61): 2061-2068  (http://education.[facility].com/faq/FAQ164)    TRIG 431 (H) 11/05/2018   CHOLHDL 7.1 (H) 11/05/2018    Insomnia Trazadone 100mg  PO at bedtime. Patient states take it every night, states if she does not she cannot sleep. This medicine helps her get to sleep and stay asleep. On medication she gets about 8 hours of sleep a night and feels refreshed in the morning. Husband does note that she snores. Does not take naps.   GERD States has been worse the past few months- pizza and spaghetti are triggers.   Asthma She has reduced smoking- a pack lasts her 5 days now.  Takes albuterol as needed maybe a few times a month PHQ2/9: Depression screen [facility] 2/9 04/09/2019 11/05/2018 09/29/2017 02/26/2017 12/28/2015  Decreased Interest 0 0 2 0 0  Down, Depressed, Hopeless 0 0 2 1 0  PHQ - 2 Score 0 0 4 1 0  Altered sleeping 0 0 1 - -  Tired, decreased energy 0 3 2 - -  Change in appetite 0 3 2 - -  Feeling bad or failure about yourself  0 0 1 - -  Trouble concentrating 0 0 0 - -  Moving slowly or fidgety/restless 0 0 0 - -  Suicidal thoughts 0 0 0 - -  PHQ-9 Score 0 6 10 - -  Difficult doing work/chores Not difficult at all  Somewhat difficult - - -     PHQ reviewed. Negative  Patient Active Problem List   Diagnosis Date Noted  . Obesity (BMI 30.0-34.9) 03/27/2017  . Fatigue 02/26/2017  . Medication monitoring encounter 02/26/2017  . Achilles tendonitis, bilateral 01/19/2016  . Proptosis 01/19/2016  . Need for hepatitis C screening test 01/19/2016  . Stool incontinence 12/28/2015  . Colon cancer screening 12/28/2015  . Right-sided back pain 12/28/2015  . Osteoarthrosis multiple sites, not specified as generalized 04/14/2015  . Annual physical exam 04/14/2015  . Breast mass, left 04/14/2015  . Anxiety  and depression 02/20/2015  . Diabetes mellitus type 2 in obese ([place]) 02/20/2015  . GERD (gastroesophageal reflux disease) 02/20/2015  . Hypercholesterolemia with hypertriglyceridemia 02/20/2015  . Arthralgia of multiple joints 02/20/2015  . Vitamin D deficiency 02/20/2015  . Nausea without vomiting 02/20/2015  . Insomnia 02/20/2015  . Diabetes ([place]) 03/15/2014  . Abdominal pain 12/15/2012    Past Medical History:  Diagnosis Date  . Anxiety   . Arthritis   . Blood transfusion without reported diagnosis    previously  . Depression   . Diabetes mellitus without complication ([place])   . Elevated platelet count 01/19/2016  . GERD (gastroesophageal reflux disease)   . Hyperlipidemia   . Proptosis 01/19/2016    Past Surgical History:  Procedure Laterality Date  . BREAST BIOPSY Left 2015?   left  . [place]   Had 3  . CHOLECYSTECTOMY  1997    Social History   Tobacco Use  . Smoking status: Current Every Day Smoker    Packs/day: 0.50    Years: 20.00    Pack years: 10.00    Types: Cigarettes    Start date: 03/20/1997    Last attempt to quit: 04/09/2018    Years since quitting: 1.0  . Smokeless tobacco: Never Used  . Tobacco comment: patient states she is working on it now  Substance Use Topics  . Alcohol use: No    Alcohol/week: 0.0 standard drinks     Current Outpatient Medications:  .  albuterol (PROVENTIL HFA;VENTOLIN HFA) 108 (90 Base) MCG/ACT inhaler, Inhale 2 puffs into the lungs every 6 (six) hours as needed for wheezing or shortness of breath., Disp: 1 Inhaler, Rfl: 0 .  Icosapent Ethyl (VASCEPA) 1 g CAPS, Two capsules by mouth twice a day (to help with triglycerides), Disp: 120 capsule, Rfl: 11 .  LANTUS SOLOSTAR 100 UNIT/ML Solostar Pen, INJECT 30 UNITS INTO THE SKIN AT BEDTIME, Disp: 15 mL, Rfl: 0 .  pioglitazone (ACTOS) 15 MG tablet, Take 1 tablet (15 mg total) by mouth daily., Disp: 30 tablet, Rfl: 5 .  pravastatin (PRAVACHOL) 20 MG  tablet, Take 1 tablet (20 mg total) by mouth at bedtime. For cholesterol, Disp: 30 tablet, Rfl: 2 .  sitaGLIPtin (JANUVIA) 100 MG tablet, Take 1 tablet (100 mg total) by mouth daily. For diabetes, Disp: 30 tablet, Rfl: 5 .  traZODone (DESYREL) 100 MG tablet, TAKE 1 TABLET BY MOUTH AT BEDTIME AS NEEDED, Disp: 30 tablet, Rfl: 5 .  Vitamin D, Ergocalciferol, (DRISDOL) 1.25 MG (50000 UT) CAPS capsule, Take 1 capsule (50,000 Units total) by mouth every 7 (seven) days., Disp: 4 capsule, Rfl: 2 .  ACCU-CHEK FASTCLIX LANCETS MISC, 1 Device by Does not apply route 3 (three) times daily. Dx E11.65, LON 99 months, Disp: 100 each, Rfl: 5 .  Blood Gluc Meter Disp-Strips (BLOOD GLUCOSE METER DISPOSABLE) DEVI, 1 each by Does not apply route  3 (three) times daily before meals., Disp: 100 each, Rfl: 3 .  famotidine (PEPCID) 20 MG tablet, Take 1 tablet (20 mg total) by mouth 2 (two) times daily as needed for heartburn or indigestion. (Patient not taking: Reported on 04/09/2019), Disp: 180 tablet, Rfl: 1 .  glucose blood test strip, Check sugars 3x a day; LON 99 months, Dx E11.65, Disp: 100 each, Rfl: 5 .  Insulin Pen Needle (ULTICARE SHORT PEN NEEDLES) 31G X 8 MM MISC, FOR USE WITH INSULIN PEN ONCE A DAY, Disp: 100 each, Rfl: 1  Allergies  Allergen Reactions  . Atorvastatin Other (See Comments)    Arthralgia  . Percocet [Oxycodone-Acetaminophen] Nausea And Vomiting    ROS    No other specific complaints in a complete review of systems (except as listed in HPI above).  Objective  There were no vitals filed for this visit.   There is no height or weight on file to calculate BMI.  Patient is alert, able to speak in full sentences without difficulty.   Assessment & Plan  1. Uncontrolled type 2 diabetes mellitus with hyperglycemia, with long-term current use of insulin ([place]) Discussed in detail risks of uncontrolled diabetes, per random sugar readings likely to have improved control. Patient states will  come in 2 weeks for physical and get labs completed there. Will add on low dose ARB for nephroprotection and work to aggressively treat sugars. Consider referral to CCM team post labs - Insulin Glargine (LANTUS SOLOSTAR) 100 UNIT/ML Solostar Pen; Inject 30 Units into the skin daily.  Dispense: 30 mL; Refill: 0 - losartan (COZAAR) 25 MG tablet; Take 0.5 tablets (12.5 mg total) by mouth daily.  Dispense: 45 tablet; Refill: 0 - HgB A1c  2. Gastroesophageal reflux disease, esophagitis presence not specified Avoid triggers, PRN tums  - famotidine (PEPCID) 20 MG tablet; Take 1 tablet (20 mg total) by mouth 2 (two) times daily as needed for heartburn or indigestion.  Dispense: 180 tablet; Refill: 1  3. Hypercholesterolemia with hypertriglyceridemia Discussed diet  - Icosapent Ethyl (VASCEPA) 1 g CAPS; Two capsules by mouth twice a day (to help with triglycerides)  Dispense: 120 capsule; Refill: 11 - Lipid Profile  4. Insomnia, unspecified type stable - traZODone (DESYREL) 100 MG tablet; Take 1 tablet (100 mg total) by mouth at bedtime as needed.  Dispense: 90 tablet; Refill: 1  5. Mild intermittent asthma without complication Encouraged continue to smoke less  - albuterol (VENTOLIN HFA) 108 (90 Base) MCG/ACT inhaler; Inhale 2 puffs into the lungs every 6 (six) hours as needed for wheezing or shortness of breath.  Dispense: 8 g; Refill: 0  6. Medication monitoring encounter - COMPLETE METABOLIC PANEL WITH GFR    I discussed the assessment and treatment plan with the patient. The patient was provided an opportunity to ask questions and all were answered. The patient agreed with the plan and demonstrated an understanding of the instructions.   The patient was advised to call back or seek an in-person evaluation if the symptoms worsen or if the condition fails to improve as anticipated.  I provided 23 minutes of non-face-to-face time during this encounter.   Fredderick Severance, NP

## 2019-04-23 ENCOUNTER — Encounter: Payer: Medicaid Other | Admitting: Nurse Practitioner

## 2019-04-23 NOTE — Progress Notes (Deleted)
Name: Faith Smith   MRN: [id number]    DOB: 1960-09-28   Date:04/23/2019       Progress Note  Subjective  Chief Complaint  No chief complaint on file.   HPI  Patient presents for annual CPE .  Diet: *** Exercise: ***   USPSTF grade A and B recommendations    Office Visit from 04/09/2019 in [facility]  AUDIT-C Score  0     Depression: Phq 9 is  {Desc; negative/positive:13464::"negative"} Depression screen [facility] 2/9 04/09/2019 11/05/2018 09/29/2017 02/26/2017 12/28/2015  Decreased Interest 0 0 2 0 0  Down, Depressed, Hopeless 0 0 2 1 0  PHQ - 2 Score 0 0 4 1 0  Altered sleeping 0 0 1 - -  Tired, decreased energy 0 3 2 - -  Change in appetite 0 3 2 - -  Feeling bad or failure about yourself  0 0 1 - -  Trouble concentrating 0 0 0 - -  Moving slowly or fidgety/restless 0 0 0 - -  Suicidal thoughts 0 0 0 - -  PHQ-9 Score 0 6 10 - -  Difficult doing work/chores Not difficult at all Somewhat difficult - - -   Hypertension: BP Readings from Last 3 Encounters:  11/16/18 (!) 156/56  11/05/18 130/72  03/17/18 130/84   Obesity: Wt Readings from Last 3 Encounters:  11/16/18 210 lb (95.3 kg)  11/05/18 210 lb 6.4 oz (95.4 kg)  03/17/18 215 lb 14.4 oz (97.9 kg)   BMI Readings from Last 3 Encounters:  11/16/18 31.93 kg/m  11/05/18 31.99 kg/m  03/17/18 32.83 kg/m    Hep C Screening: *** STD testing and prevention (HIV/chl/gon/syphilis): *** Intimate partner violence:*** Sexual History/Pain during Intercourse: Menstrual History/LMP/Abnormal Bleeding:  Incontinence Symptoms:   Advanced Care Planning: A voluntary discussion about advance care planning including the explanation and discussion of advance directives.  Discussed health care proxy and Living will, and the patient was able to identify a health care proxy as ***.  Patient {DOES_DOES HUD:14970} have a living will at present time. If patient does have living will, I have requested they bring this to  the clinic to be scanned in to their chart.  Breast cancer: ordered mammogram  No results found for: [facility]  Cervical cancer screening: due today   Osteoporosis Screening: discussed Lipids:  Lab Results  Component Value Date   CHOL 242 (H) 11/05/2018   CHOL 244 (H) 09/29/2017   CHOL 232 (H) 02/26/2017   Lab Results  Component Value Date   HDL 34 (L) 11/05/2018   HDL 32 (L) 09/29/2017   HDL 30 (L) 02/26/2017   Lab Results  Component Value Date   [facility]  11/05/2018     Comment:     . LDL cholesterol not calculated. Triglyceride levels greater than 400 mg/dL invalidate calculated LDL results. . Reference range: <100 . Desirable range <100 mg/dL for primary prevention;   <70 mg/dL for patients with CHD or diabetic patients  with > or = 2 CHD risk factors. Marland Kitchen LDL-C is now calculated using the Martin-Hopkins  calculation, which is a validated novel method providing  better accuracy than the Friedewald equation in the  estimation of LDL-C.  Cresenciano Genre et al. Annamaria Helling. 2637;858(85): 2061-2068  (http://education.[facility].com/faq/FAQ164)    [place]  09/29/2017     Comment:     . LDL cholesterol not calculated. Triglyceride levels greater than 400 mg/dL invalidate calculated LDL results. . Reference range: <100 . Desirable range <100  mg/dL for primary prevention;   <70 mg/dL for patients with CHD or diabetic patients  with > or = 2 CHD risk factors. Marland Kitchen LDL-C is now calculated using the Martin-Hopkins  calculation, which is a validated novel method providing  better accuracy than the Friedewald equation in the  estimation of LDL-C.  Cresenciano Genre et al. Annamaria Helling. 7616;073(71): 2061-2068  (http://education.[facility].com/faq/FAQ164)    LDLCALC NOT CALC 02/26/2017   Lab Results  Component Value Date   TRIG 431 (H) 11/05/2018   TRIG 459 (H) 09/29/2017   TRIG 514 (H) 02/26/2017   Lab Results  Component Value Date   CHOLHDL 7.1 (H) 11/05/2018   CHOLHDL 7.6 (H)  09/29/2017   CHOLHDL 7.7 (H) 02/26/2017   No results found for: LDLDIRECT  Glucose:  Glucose  Date Value Ref Range Status  05/07/2014 192 (H) 65 - 99 mg/dL Final  09/18/2013 356 (H) 65 - 99 mg/dL Final  09/05/2013 656 (HH) 65 - 99 mg/dL Final   Glucose, Bld  Date Value Ref Range Status  11/05/2018 325 (H) 65 - 99 mg/dL Final    Comment:    .            Fasting reference interval . For someone without known diabetes, a glucose value >125 mg/dL indicates that they may have diabetes and this should be confirmed with a follow-up test. .   09/29/2017 393 (H) 65 - 139 mg/dL Final    Comment:    .        Non-fasting reference interval .   02/26/2017 476 (H) 65 - 99 mg/dL Final    Comment:    Result repeated and verified.   Glucose-Capillary  Date Value Ref Range Status  07/26/2016 330 (H) 65 - 99 mg/dL Final  07/26/2016 373 (H) 65 - 99 mg/dL Final  09/28/2015 296 (H) 65 - 99 mg/dL Final    Skin cancer: discussed  Colorectal cancer: due  Lung cancer:  *** Low Dose CT Chest recommended if Age 29-80 years, 30 pack-year currently smoking OR have quit w/in 15years. Patient {DOES NOT does:27190::"does not"} qualify.     Patient Active Problem List   Diagnosis Date Noted  . Asthma 04/09/2019  . Uncontrolled type 2 diabetes mellitus with hyperglycemia, with long-term current use of insulin ([place]) 04/09/2019  . Obesity (BMI 30.0-34.9) 03/27/2017  . Proptosis 01/19/2016  . Stool incontinence 12/28/2015  . Osteoarthrosis multiple sites, not specified as generalized 04/14/2015  . Breast mass, left 04/14/2015  . Anxiety and depression 02/20/2015  . GERD (gastroesophageal reflux disease) 02/20/2015  . Hypercholesterolemia with hypertriglyceridemia 02/20/2015  . Arthralgia of multiple joints 02/20/2015  . Vitamin D deficiency 02/20/2015  . Insomnia 02/20/2015    Past Surgical History:  Procedure Laterality Date  . BREAST BIOPSY Left 2015?   left  . [place]   Had 3  . CHOLECYSTECTOMY  1997    Family History  Problem Relation Age of Onset  . Hypertension Mother   . Cervical cancer Mother   . Depression Brother   . Stroke Father   . Ulcers Father   . Diabetes Maternal Grandmother   . Brain cancer Paternal Grandmother   . Hypertension Paternal Grandfather     Social History   Socioeconomic History  . Marital status: Married    Spouse name: Not on file  . Number of children: 7  . Years of education: Not on file  . Highest education level: Not on file  Occupational History  . Not on file  Social Needs  . Financial resource strain: Not on file  . Food insecurity    Worry: Not on file    Inability: Not on file  . Transportation needs    Medical: Not on file    Non-medical: Not on file  Tobacco Use  . Smoking status: Current Every Day Smoker    Packs/day: 0.50    Years: 20.00    Pack years: 10.00    Types: Cigarettes    Start date: 03/20/1997    Last attempt to quit: 04/09/2018    Years since quitting: 1.0  . Smokeless tobacco: Never Used  . Tobacco comment: patient states she is working on it now  Substance and Sexual Activity  . Alcohol use: No    Alcohol/week: 0.0 standard drinks  . Drug use: No  . Sexual activity: Yes    Partners: Male    Birth control/protection: Post-menopausal  Lifestyle  . Physical activity    Days per week: Not on file    Minutes per session: Not on file  . Stress: Not on file  Relationships  . Social Herbalist on phone: Not on file    Gets together: Not on file    Attends religious service: Not on file    Active member of club or organization: Not on file    Attends meetings of clubs or organizations: Not on file    Relationship status: Not on file  . Intimate partner violence    Fear of current or ex partner: Not on file    Emotionally abused: Not on file    Physically abused: Not on file    Forced sexual activity: Not on file  Other Topics Concern  . Not on  file  Social History Narrative  . Not on file     Current Outpatient Medications:  .  ACCU-CHEK FASTCLIX LANCETS MISC, 1 Device by Does not apply route 3 (three) times daily. Dx E11.65, LON 99 months, Disp: 100 each, Rfl: 5 .  albuterol (VENTOLIN HFA) 108 (90 Base) MCG/ACT inhaler, Inhale 2 puffs into the lungs every 6 (six) hours as needed for wheezing or shortness of breath., Disp: 8 g, Rfl: 0 .  Blood Gluc Meter Disp-Strips (BLOOD GLUCOSE METER DISPOSABLE) DEVI, 1 each by Does not apply route 3 (three) times daily before meals., Disp: 100 each, Rfl: 3 .  famotidine (PEPCID) 20 MG tablet, Take 1 tablet (20 mg total) by mouth 2 (two) times daily as needed for heartburn or indigestion., Disp: 180 tablet, Rfl: 1 .  glucose blood test strip, Check sugars 3x a day; LON 99 months, Dx E11.65, Disp: 100 each, Rfl: 5 .  Icosapent Ethyl (VASCEPA) 1 g CAPS, Two capsules by mouth twice a day (to help with triglycerides), Disp: 120 capsule, Rfl: 11 .  Insulin Glargine (LANTUS SOLOSTAR) 100 UNIT/ML Solostar Pen, Inject 30 Units into the skin daily., Disp: 30 mL, Rfl: 0 .  Insulin Pen Needle (ULTICARE SHORT PEN NEEDLES) 31G X 8 MM MISC, FOR USE WITH INSULIN PEN ONCE A DAY, Disp: 100 each, Rfl: 1 .  losartan (COZAAR) 25 MG tablet, Take 0.5 tablets (12.5 mg total) by mouth daily., Disp: 45 tablet, Rfl: 0 .  pioglitazone (ACTOS) 15 MG tablet, Take 1 tablet (15 mg total) by mouth daily., Disp: 30 tablet, Rfl: 5 .  pravastatin (PRAVACHOL) 20 MG tablet, Take 1 tablet (20 mg total) by mouth at bedtime. For cholesterol,  Disp: 30 tablet, Rfl: 2 .  sitaGLIPtin (JANUVIA) 100 MG tablet, Take 1 tablet (100 mg total) by mouth daily. For diabetes, Disp: 30 tablet, Rfl: 5 .  traZODone (DESYREL) 100 MG tablet, Take 1 tablet (100 mg total) by mouth at bedtime as needed., Disp: 90 tablet, Rfl: 1 .  Vitamin D, Ergocalciferol, (DRISDOL) 1.25 MG (50000 UT) CAPS capsule, Take 1 capsule (50,000 Units total) by mouth every 7 (seven)  days., Disp: 4 capsule, Rfl: 2  Allergies  Allergen Reactions  . Atorvastatin Other (See Comments)    Arthralgia  . Percocet [Oxycodone-Acetaminophen] Nausea And Vomiting     ROS  ***  Objective  There were no vitals filed for this visit.  There is no height or weight on file to calculate BMI.  Physical Exam ***   No results found for this or any previous visit (from the past 2160 hour(s)).   Fall Risk: Fall Risk  04/09/2019 11/05/2018 09/29/2017 02/26/2017 12/28/2015  Falls in the past year? 0 0 No No No  Number falls in past yr: 0 0 - - -  Injury with Fall? 0 0 - - -   ***  Functional Status Survey:   ***  Assessment & Plan  There are no diagnoses linked to this encounter.  -USPSTF grade A and B recommendations reviewed with patient; age-appropriate recommendations, preventive care, screening tests, etc discussed and encouraged; healthy living encouraged; see AVS for patient education given to patient -Discussed importance of 150 minutes of physical activity weekly, eat two servings of fish weekly, eat one serving of tree nuts ( cashews, pistachios, pecans, almonds.Marland Kitchen) every other day, eat 6 servings of fruit/vegetables daily and drink plenty of water and avoid sweet beverages.   -Reviewed Health Maintenance: ***

## 2019-07-02 ENCOUNTER — Ambulatory Visit: Payer: Medicaid Other | Admitting: Podiatry

## 2019-07-06 ENCOUNTER — Encounter: Payer: Self-pay | Admitting: Podiatry

## 2019-07-06 ENCOUNTER — Ambulatory Visit: Payer: Medicaid Other | Admitting: Podiatry

## 2019-07-06 ENCOUNTER — Other Ambulatory Visit: Payer: Self-pay

## 2019-07-06 DIAGNOSIS — M7662 Achilles tendinitis, left leg: Secondary | ICD-10-CM | POA: Diagnosis not present

## 2019-07-06 DIAGNOSIS — M7732 Calcaneal spur, left foot: Secondary | ICD-10-CM

## 2019-07-06 MED ORDER — HYDROCODONE-ACETAMINOPHEN 5-325 MG PO TABS: 1.0000 | ORAL_TABLET | Freq: Four times a day (QID) | ORAL | 0 refills | Status: DC | PRN

## 2019-07-06 NOTE — Patient Instructions (Signed)
Pre-Operative Instructions  Congratulations, you have decided to take an important step towards improving your quality of life.  You can be assured that the doctors and staff at [facility] will be with you every step of the way.  Here are some important things you should know:  1. Plan to be at the surgery center/hospital at least 1 (one) hour prior to your scheduled time, unless otherwise directed by the surgical center/hospital staff.  You must have a responsible adult accompany you, remain during the surgery and drive you home.  Make sure you have directions to the surgical center/hospital to ensure you arrive on time. 2. If you are having surgery at [facility] or [place] hospitals, you will need a copy of your medical history and physical form from your family physician within one month prior to the date of surgery. We will give you a form for your primary physician to complete.  3. We make every effort to accommodate the date you request for surgery.  However, there are times where surgery dates or times have to be moved.  We will contact you as soon as possible if a change in schedule is required.   4. No aspirin/ibuprofen for one week before surgery.  If you are on aspirin, any non-steroidal anti-inflammatory medications (Mobic, Aleve, Ibuprofen) should not be taken seven (7) days prior to your surgery.  You make take Tylenol for pain prior to surgery.  5. Medications - If you are taking daily heart and blood pressure medications, seizure, reflux, allergy, asthma, anxiety, pain or diabetes medications, make sure you notify the surgery center/hospital before the day of surgery so they can tell you which medications you should take or avoid the day of surgery. 6. No food or drink after midnight the night before surgery unless directed otherwise by surgical center/hospital staff. 7. No alcoholic beverages 24-hours prior to surgery.  No smoking 24-hours prior or 24-hours after  surgery. 8. Wear loose pants or shorts. They should be loose enough to fit over bandages, boots, and casts. 9. Don't wear slip-on shoes. Sneakers are preferred. 10. Bring your boot with you to the surgery center/hospital.  Also bring crutches or a walker if your physician has prescribed it for you.  If you do not have this equipment, it will be provided for you after surgery. 11. If you have not been contacted by the surgery center/hospital by the day before your surgery, call to confirm the date and time of your surgery. 12. Leave-time from work may vary depending on the type of surgery you have.  Appropriate arrangements should be made prior to surgery with your employer. 13. Prescriptions will be provided immediately following surgery by your doctor.  Fill these as soon as possible after surgery and take the medication as directed. Pain medications will not be refilled on weekends and must be approved by the doctor. 14. Remove nail polish on the operative foot and avoid getting pedicures prior to surgery. 15. Wash the night before surgery.  The night before surgery wash the foot and leg well with water and the antibacterial soap provided. Be sure to pay special attention to beneath the toenails and in between the toes.  Wash for at least three (3) minutes. Rinse thoroughly with water and dry well with a towel.  Perform this wash unless told not to do so by your physician.  Enclosed: 1 Ice pack (please put in freezer the night before surgery)   1 Hibiclens skin cleaner     Pre-op instructions  If you have any questions regarding the instructions, please do not hesitate to call our office.  [place]: [address], [place], [place] [address] -- [phone]  [place]: [address] [place], [place] [address] -- [phone]  [place]: [address]  [place],  [address] -- [phone]   Website: [email or web address] 

## 2019-07-07 ENCOUNTER — Other Ambulatory Visit: Payer: Self-pay | Admitting: Family Medicine

## 2019-07-07 ENCOUNTER — Telehealth: Payer: Self-pay | Admitting: *Deleted

## 2019-07-07 NOTE — Telephone Encounter (Signed)
I am returning your call.  Dr. Amalia Hailey' next available date for surgery is August 13, 2019.  Is that date okay for you?  "That is fine."  Someone from the surgical center will give you a call a day or two prior to your surgery date and will give you your arrival time.  You need to go online and register via the surgical center's online portal if you have access to a computer.  "I don't have a computer."  Someone will call you from the surgical center to obtain the information that they need.  I'll schedule your surgery for 08/13/2019.

## 2019-07-07 NOTE — Telephone Encounter (Signed)
"  I am calling to schedule for surgery, for an operation."

## 2019-07-08 NOTE — Progress Notes (Signed)
   HPI: 58 year old female with PMHx of T2DM presenting today for follow up evaluation of left insertional achilles tendinitis a posterior heel spur left. She states the pain flared back up about two months ago and is progressively worsening. Touching the area increases the pain. She has been using a CAM boot, taking Tylenol and Ibuprofen, using ice and heat therapy, all with no significant relief. Patient is here for further evaluation and treatment.   Past Medical History:  Diagnosis Date  . Anxiety   . Arthritis   . Asthma 04/09/2019  . Blood transfusion without reported diagnosis    previously  . Depression   . Diabetes mellitus without complication ([place])   . Elevated platelet count 01/19/2016  . GERD (gastroesophageal reflux disease)   . Hyperlipidemia   . Proptosis 01/19/2016      Physical Exam: General: The patient is alert and oriented x3 in no acute distress.  Dermatology: Skin is warm, dry and supple bilateral lower extremities. Negative for open lesions or macerations.  Vascular: Palpable pedal pulses bilaterally. No edema or erythema noted. Capillary refill within normal limits.  Neurological: Epicritic and protective threshold grossly intact bilaterally.   Musculoskeletal Exam: Pain on palpation noted to the posterior tubercle of the left calcaneus at the insertion of the Achilles tendon consistent with retrocalcaneal bursitis. Range of motion within normal limits. Muscle strength 5/5 in all muscle groups bilateral lower extremities.  MRI Impression from 04/18/2018: 1. Mild these method changes at the Achilles tendon insertion. Tiny interstitial tear of the distal Achilles tendon just proximal to its insertion. Achilles tendon is otherwise intact without tendinosis.  Assessment: 1. Insertional Achilles tendinitis left  2. Posterior heel spur left    Plan of Care:  1. Patient was evaluated.  2. Today we discussed the conservative versus surgical management of the  presenting pathology. The patient opts for surgical management. All possible complications and details of the procedure were explained. All patient questions were answered. No guarantees were expressed or implied. 3. Authorization for surgery was initiated today. Surgery will consist of posterior heel spur resection with repair of Achilles left.  4. Continue using CAM boot.  5. Prescription for Vicodin 5/325 mg provided to patient.  6. Return to clinic one week post op.    Edrick Kins, DPM [facility]  Dr. Edrick Kins, [address]                                        [place], [place] [address]                Office [phone]  Fax [phone]

## 2019-07-09 ENCOUNTER — Other Ambulatory Visit: Payer: Self-pay

## 2019-07-09 ENCOUNTER — Ambulatory Visit (INDEPENDENT_AMBULATORY_CARE_PROVIDER_SITE_OTHER): Payer: Medicaid Other | Admitting: Family Medicine

## 2019-07-09 ENCOUNTER — Encounter: Payer: Self-pay | Admitting: Family Medicine

## 2019-07-09 VITALS — BP 132/70 | HR 73 | Temp 98.3°F | Resp 14 | Ht 67.5 in | Wt 210.0 lb

## 2019-07-09 DIAGNOSIS — E559 Vitamin D deficiency, unspecified: Secondary | ICD-10-CM

## 2019-07-09 DIAGNOSIS — J4520 Mild intermittent asthma, uncomplicated: Secondary | ICD-10-CM

## 2019-07-09 DIAGNOSIS — E782 Mixed hyperlipidemia: Secondary | ICD-10-CM | POA: Diagnosis not present

## 2019-07-09 DIAGNOSIS — D473 Essential (hemorrhagic) thrombocythemia: Secondary | ICD-10-CM

## 2019-07-09 DIAGNOSIS — K219 Gastro-esophageal reflux disease without esophagitis: Secondary | ICD-10-CM

## 2019-07-09 DIAGNOSIS — Z23 Encounter for immunization: Secondary | ICD-10-CM

## 2019-07-09 DIAGNOSIS — Z794 Long term (current) use of insulin: Secondary | ICD-10-CM

## 2019-07-09 DIAGNOSIS — Z5181 Encounter for therapeutic drug level monitoring: Secondary | ICD-10-CM | POA: Diagnosis not present

## 2019-07-09 DIAGNOSIS — E1165 Type 2 diabetes mellitus with hyperglycemia: Secondary | ICD-10-CM | POA: Diagnosis not present

## 2019-07-09 HISTORY — DX: Essential (hemorrhagic) thrombocythemia: D47.3

## 2019-07-09 MED ORDER — INSULIN PEN NEEDLE 32G X 4 MM MISC: 1 refills | Status: DC

## 2019-07-09 MED ORDER — LANTUS SOLOSTAR 100 UNIT/ML ~~LOC~~ SOPN: 30.0000 [IU] | PEN_INJECTOR | Freq: Every day | SUBCUTANEOUS | 0 refills | Status: DC

## 2019-07-09 MED ORDER — GLUCOSE BLOOD VI STRP: ORAL_STRIP | 5 refills | Status: DC

## 2019-07-09 MED ORDER — ACCU-CHEK FASTCLIX LANCETS MISC: 1.0000 | Freq: Three times a day (TID) | 5 refills | Status: DC

## 2019-07-09 NOTE — Progress Notes (Signed)
Name: Faith Smith   MRN: [id number]    DOB: 1961/04/23   Date:07/09/2019       Progress Note  Subjective:    Chief Complaint  Chief Complaint  Patient presents with  . Follow-up  . Diabetes    BG 98  . Medication Refill    I connected with  Tawanna Cooler on 07/09/19 at 10:00 AM EDT by telephone and verified that I am speaking with the correct person using two identifiers.  I discussed the limitations, risks, security and privacy concerns of performing an evaluation and management service by telephone and the availability of in person appointments. Staff also discussed with the patient that there may be a patient responsible charge related to this service. Patient Location: home Provider Location: [facility] Additional Individuals present:  none  HPI Scheduled for telephone f/up for DM - she hasn't been seen in person since Feb 2020 and no labs in the past 8 months with poorly controlled IDDM  Diabetes Mellitus Type II: DM dx 5+ years ago Currently managing with insulin pen - 30 units SQ at night, januvia 100 mg Pt notes excellent med compliance Pt has no SE from meds. Fasting CBGs typically run 98, She is checking usually once a day, recent high CBG 150 and low CBG 95.  No hypoglycemic episodes.   Denies: Polyuria, polydipsia, polyphagia, vision changes, or neuropathy - has heel spur seeing foot doctor   Recent pertinent labs: Lab Results  Component Value Date   HGBA1C 13.0 (A) 11/05/2018      Component Value Date/Time   NA 136 11/05/2018 1447   NA 142 01/19/2016 1003   NA 140 05/07/2014 0040   K 3.5 11/05/2018 1447   K 3.1 (L) 05/07/2014 0040   CL 100 11/05/2018 1447   CL 103 05/07/2014 0040   CO2 29 11/05/2018 1447   CO2 29 05/07/2014 0040   GLUCOSE 325 (H) 11/05/2018 1447   GLUCOSE 192 (H) 05/07/2014 0040   BUN 10 11/05/2018 1447   BUN 11 01/19/2016 1003   BUN 16 05/07/2014 0040   CREATININE 0.81 11/05/2018 1447   CALCIUM 10.1 11/05/2018 1447   CALCIUM 8.6 05/07/2014 0040   PROT 6.9 11/05/2018 1447   PROT 7.0 01/19/2016 1003   PROT 7.1 05/07/2014 0335   ALBUMIN 4.0 02/26/2017 1535   ALBUMIN 4.4 01/19/2016 1003   ALBUMIN 3.3 (L) 05/07/2014 0335   AST 12 11/05/2018 1447   AST 21 05/07/2014 0335   ALT 10 11/05/2018 1447   ALT 19 05/07/2014 0335   ALKPHOS 118 02/26/2017 1535   ALKPHOS 75 05/07/2014 0335   BILITOT 0.3 11/05/2018 1447   BILITOT 0.2 01/19/2016 1003   BILITOT 0.3 05/07/2014 0335   GFRNONAA 79 11/05/2018 1447   GFRAA 92 11/05/2018 1447   Current diet: in general, a "healthy" diet   Current exercise: none - not able to walk/ambulate right now due to heel spur  Not UTD on DM foot exam and eye exam, Ophtho referral put in, she is currently seeing podiatry, and foot exam next due 10/2019 ACEI/ARB: No Statin: No  Also due for PPV23 and Flu shot  Not taking pravastatin or losartan, says she took cholesterol meds w/o any problems in the past, just ran out.  She states losartan at 12.5 mg dose gave her headaches and so she's not taking anymore.    She is not monitoring her BP at home. She is going to have surgery on her "heel spur"  in a few weeks, and is currently in a boot and not supposed to walk on it.  She gets rides from her daughter.  I spoke with her daughter Danae Chen at the end of the visit today she states she can bring her by the clinic to get lab work if we can help get her into [facility] for labs in clinic.  Asthma - currently well controlled, rarely using inhaler, no nighttime wakening, activity not limited by sx  GERD - still on PPI, GI sx well controlled, no abd pain, bloating, indigestion, melena hematochezia, N/V/D   Patient Active Problem List   Diagnosis Date Noted  . Asthma 04/09/2019  . Uncontrolled type 2 diabetes mellitus with hyperglycemia, with long-term current use of insulin ([place]) 04/09/2019  . Obesity (BMI 30.0-34.9) 03/27/2017  . Proptosis 01/19/2016  . Stool incontinence 12/28/2015  .  Osteoarthrosis multiple sites, not specified as generalized 04/14/2015  . Breast mass, left 04/14/2015  . Anxiety and depression 02/20/2015  . GERD (gastroesophageal reflux disease) 02/20/2015  . Hypercholesterolemia with hypertriglyceridemia 02/20/2015  . Arthralgia of multiple joints 02/20/2015  . Vitamin D deficiency 02/20/2015  . Insomnia 02/20/2015    Past Surgical History:  Procedure Laterality Date  . BREAST BIOPSY Left 2015?   left  . [place]   Had 3  . CHOLECYSTECTOMY  1997    Family History  Problem Relation Age of Onset  . Hypertension Mother   . Cervical cancer Mother   . Depression Brother   . Stroke Father   . Ulcers Father   . Diabetes Maternal Grandmother   . Brain cancer Paternal Grandmother   . Hypertension Paternal Grandfather     Social History   Socioeconomic History  . Marital status: Married    Spouse name: Not on file  . Number of children: 7  . Years of education: Not on file  . Highest education level: Not on file  Occupational History  . Not on file  Social Needs  . Financial resource strain: Not on file  . Food insecurity    Worry: Not on file    Inability: Not on file  . Transportation needs    Medical: Not on file    Non-medical: Not on file  Tobacco Use  . Smoking status: Current Every Day Smoker    Packs/day: 0.50    Years: 20.00    Pack years: 10.00    Types: Cigarettes    Start date: 03/20/1997    Last attempt to quit: 04/09/2018    Years since quitting: 1.2  . Smokeless tobacco: Never Used  . Tobacco comment: patient states she is working on it now  Substance and Sexual Activity  . Alcohol use: No    Alcohol/week: 0.0 standard drinks  . Drug use: No  . Sexual activity: Yes    Partners: Male    Birth control/protection: Post-menopausal  Lifestyle  . Physical activity    Days per week: Not on file    Minutes per session: Not on file  . Stress: Not on file  Relationships  . Social  Herbalist on phone: Not on file    Gets together: Not on file    Attends religious service: Not on file    Active member of club or organization: Not on file    Attends meetings of clubs or organizations: Not on file    Relationship status: Not on file  . Intimate partner violence  Fear of current or ex partner: Not on file    Emotionally abused: Not on file    Physically abused: Not on file    Forced sexual activity: Not on file  Other Topics Concern  . Not on file  Social History Narrative  . Not on file     Current Outpatient Medications:  .  albuterol (VENTOLIN HFA) 108 (90 Base) MCG/ACT inhaler, Inhale 2 puffs into the lungs every 6 (six) hours as needed for wheezing or shortness of breath., Disp: 8 g, Rfl: 0 .  famotidine (PEPCID) 20 MG tablet, Take 1 tablet (20 mg total) by mouth 2 (two) times daily as needed for heartburn or indigestion., Disp: 180 tablet, Rfl: 1 .  HYDROcodone-acetaminophen (NORCO/VICODIN) 5-325 MG tablet, Take 1 tablet by mouth every 6 (six) hours as needed for moderate pain., Disp: 30 tablet, Rfl: 0 .  Insulin Glargine (LANTUS SOLOSTAR) 100 UNIT/ML Solostar Pen, Inject 30 Units into the skin daily., Disp: 30 mL, Rfl: 0 .  JANUVIA 100 MG tablet, TAKE 1 TABLET BY MOUTH DAILY FOR DIABETES, Disp: 30 tablet, Rfl: 0 .  pioglitazone (ACTOS) 15 MG tablet, Take 1 tablet (15 mg total) by mouth daily., Disp: 30 tablet, Rfl: 5 .  traZODone (DESYREL) 100 MG tablet, Take 1 tablet (100 mg total) by mouth at bedtime as needed., Disp: 90 tablet, Rfl: 1 .  ACCU-CHEK FASTCLIX LANCETS MISC, 1 Device by Does not apply route 3 (three) times daily. Dx E11.65, LON 99 months, Disp: 100 each, Rfl: 5 .  Blood Gluc Meter Disp-Strips (BLOOD GLUCOSE METER DISPOSABLE) DEVI, 1 each by Does not apply route 3 (three) times daily before meals., Disp: 100 each, Rfl: 3 .  glucose blood test strip, Check sugars 3x a day; LON 99 months, Dx E11.65, Disp: 100 each, Rfl: 5 .  Insulin Pen  Needle (ULTICARE SHORT PEN NEEDLES) 31G X 8 MM MISC, FOR USE WITH INSULIN PEN ONCE A DAY, Disp: 100 each, Rfl: 1 .  losartan (COZAAR) 25 MG tablet, Take 0.5 tablets (12.5 mg total) by mouth daily. (Patient not taking: Reported on 07/09/2019), Disp: 45 tablet, Rfl: 0 .  pravastatin (PRAVACHOL) 20 MG tablet, Take 1 tablet (20 mg total) by mouth at bedtime. For cholesterol (Patient not taking: Reported on 07/09/2019), Disp: 30 tablet, Rfl: 2  Allergies  Allergen Reactions  . Atorvastatin Other (See Comments)    Arthralgia  . Percocet [Oxycodone-Acetaminophen] Nausea And Vomiting    I personally reviewed active problem list, medication list, allergies, family history, social history, health maintenance, notes from last encounter, lab results with the patient/caregiver today.  Review of Systems  Constitutional: Negative.   HENT: Negative.   Eyes: Negative.   Respiratory: Negative.   Cardiovascular: Negative.   Gastrointestinal: Negative.   Endocrine: Negative.   Genitourinary: Negative.   Musculoskeletal: Negative.   Skin: Negative.   Allergic/Immunologic: Negative.   Neurological: Negative.   Hematological: Negative.   Psychiatric/Behavioral: Negative.   All other systems reviewed and are negative.     Objective:    Virtual encounter, vitals limited, only able to obtain the following: There were no vitals filed for this visit. There is no height or weight on file to calculate BMI. Nursing Note and Vital Signs reviewed.  Physical Exam Pulmonary:     Effort: No respiratory distress.     Comments: No audible distress, wheeze or stridor Neurological:     Mental Status: She is alert.  Psychiatric:     Comments: Pt pleasant  and answering questions appropriately     PE limited by telephone encounter  No results found for this or any previous visit (from the past 72 hour(s)).  PHQ2/9: Depression screen [facility] 2/9 07/09/2019 04/09/2019 11/05/2018 09/29/2017 02/26/2017  Decreased  Interest 0 0 0 2 0  Down, Depressed, Hopeless 0 0 0 2 1  PHQ - 2 Score 0 0 0 4 1  Altered sleeping 0 0 0 1 -  Tired, decreased energy 0 0 3 2 -  Change in appetite 0 0 3 2 -  Feeling bad or failure about yourself  0 0 0 1 -  Trouble concentrating 0 0 0 0 -  Moving slowly or fidgety/restless 0 0 0 0 -  Suicidal thoughts 0 0 0 0 -  PHQ-9 Score 0 0 6 10 -  Difficult doing work/chores Not difficult at all Not difficult at all Somewhat difficult - -   PHQ-2/9 Result is negative.    Fall Risk: Fall Risk  07/09/2019 04/09/2019 11/05/2018 09/29/2017 02/26/2017  Falls in the past year? 0 0 0 No No  Number falls in past yr: 0 0 0 - -  Injury with Fall? 0 0 0 - -     Assessment and Plan:      ICD-10-CM   1. Uncontrolled type 2 diabetes mellitus with hyperglycemia, with long-term current use of insulin (HCC)  E11.65 Insulin Glargine (LANTUS SOLOSTAR) 100 UNIT/ML Solostar Pen   Z79.4 Insulin Pen Needle 32G X 4 MM MISC    COMPLETE METABOLIC PANEL WITH GFR    Hemoglobin A1c    Ambulatory referral to Ophthalmology    glucose blood test strip    Accu-Chek FastClix Lancets MISC   IDDM compliant with meds, ne recent labs, last A1C 13, needs labs rechecked sugars sound like its much better controlled, refills sent in  2. Hypercholesterolemia with hypertriglyceridemia  [id number] COMPLETE METABOLIC PANEL WITH GFR    Lipid panel   she stopped meds, will send in refills once labs checked.  did not tolerate lipitor in the past, on pravastatin, possibly switch to crestor for better control?  3. Vitamin D deficiency  E55.9 VITAMIN D 25 Hydroxy (Vit-D Deficiency, Fractures)   very low with last labs, recheck  4. Medication monitoring encounter  Z51.81 CBC with Differential/Platelet    COMPLETE METABOLIC PANEL WITH GFR    Lipid panel    Hemoglobin A1c    VITAMIN D 25 Hydroxy (Vit-D Deficiency, Fractures)  5. Mild intermittent asthma without complication  [id number] CBC with Differential/Platelet   currently  well controlled with SABA  6. Essential (hemorrhagic) thrombocythemia ([place]) Chronic D47.3 CBC with Differential/Platelet   recheck labs/platelets  7. Gastroesophageal reflux disease, unspecified whether esophagitis present  K21.9    well controlled  8. Need for pneumococcal vaccination  Z23   9. Need for influenza vaccination  Z23    Try to offer and give needed vaccines when pt comes for labs/VS   I discussed the assessment and treatment plan with the patient. The patient was provided an opportunity to ask questions and all were answered. The patient agreed with the plan and demonstrated an understanding of the instructions.   The patient was advised to call back or seek an in-person evaluation if the symptoms worsen or if the condition fails to improve as anticipated.   I provided 18 minutes of non-face-to-face time during this encounter.  Delsa Grana, PA-C 07/09/19 10:21 AM

## 2019-07-09 NOTE — Addendum Note (Signed)
Addended by: Docia Furl on: 07/09/2019 02:55 PM   Modules accepted: Orders

## 2019-07-10 LAB — COMPLETE METABOLIC PANEL WITH GFR
AG Ratio: 1.6 (calc) (ref 1.0–2.5)
ALT: 10 U/L (ref 6–29)
AST: 11 U/L (ref 10–35)
Albumin: 4.2 g/dL (ref 3.6–5.1)
Alkaline phosphatase (APISO): 82 U/L (ref 37–153)
BUN: 14 mg/dL (ref 7–25)
CO2: 27 mmol/L (ref 20–32)
Calcium: 9.8 mg/dL (ref 8.6–10.4)
Chloride: 103 mmol/L (ref 98–110)
Creat: 0.77 mg/dL (ref 0.50–1.05)
GFR, Est African American: 98 mL/min/{1.73_m2} (ref >=60)
GFR, Est Non African American: 85 mL/min/{1.73_m2} (ref >=60)
Globulin: 2.6 g/dL (calc) (ref 1.9–3.7)
Glucose, Bld: 197 mg/dL — ABNORMAL HIGH (ref 65–99)
Potassium: 3.5 mmol/L (ref 3.5–5.3)
Sodium: 138 mmol/L (ref 135–146)
Total Bilirubin: 0.3 mg/dL (ref 0.2–1.2)
Total Protein: 6.8 g/dL (ref 6.1–8.1)

## 2019-07-10 LAB — CBC WITH DIFFERENTIAL/PLATELET
Absolute Monocytes: 312 cells/uL (ref 200–950)
Basophils Absolute: 62 cells/uL (ref 0–200)
Basophils Relative: 0.7 %
Eosinophils Absolute: 285 cells/uL (ref 15–500)
Eosinophils Relative: 3.2 %
HCT: 38.3 % (ref 35.0–45.0)
Hemoglobin: 13.1 g/dL (ref 11.7–15.5)
Lymphs Abs: 2866 cells/uL (ref 850–3900)
MCH: 28 pg (ref 27.0–33.0)
MCHC: 34.2 g/dL (ref 32.0–36.0)
MCV: 81.8 fL (ref 80.0–100.0)
MPV: 10.2 fL (ref 7.5–12.5)
Monocytes Relative: 3.5 %
Neutro Abs: 5376 cells/uL (ref 1500–7800)
Neutrophils Relative %: 60.4 %
Platelets: 377 10*3/uL (ref 140–400)
RBC: 4.68 10*6/uL (ref 3.80–5.10)
RDW: 13.7 % (ref 11.0–15.0)
Total Lymphocyte: 32.2 %
WBC: 8.9 10*3/uL (ref 3.8–10.8)

## 2019-07-10 LAB — LIPID PANEL
Cholesterol: 250 mg/dL — ABNORMAL HIGH (ref <200)
HDL: 31 mg/dL — ABNORMAL LOW (ref >=50)
LDL Cholesterol (Calc): 178 mg/dL (calc) — ABNORMAL HIGH
Non-HDL Cholesterol (Calc): 219 mg/dL (calc) — ABNORMAL HIGH (ref <130)
Total CHOL/HDL Ratio: 8.1 (calc) — ABNORMAL HIGH (ref <5.0)
Triglycerides: 248 mg/dL — ABNORMAL HIGH (ref <150)

## 2019-07-10 LAB — HEMOGLOBIN A1C
Hgb A1c MFr Bld: 7.9 % of total Hgb — ABNORMAL HIGH (ref <5.7)
Mean Plasma Glucose: 180 (calc)
eAG (mmol/L): 10 (calc)

## 2019-07-10 LAB — VITAMIN D 25 HYDROXY (VIT D DEFICIENCY, FRACTURES): Vit D, 25-Hydroxy: 23 ng/mL — ABNORMAL LOW (ref 30–100)

## 2019-07-15 ENCOUNTER — Other Ambulatory Visit: Payer: Self-pay | Admitting: *Deleted

## 2019-07-15 DIAGNOSIS — M7732 Calcaneal spur, left foot: Secondary | ICD-10-CM

## 2019-07-15 DIAGNOSIS — M7662 Achilles tendinitis, left leg: Secondary | ICD-10-CM

## 2019-07-15 NOTE — Progress Notes (Signed)
I sent a request for assistance in getting a  knee scooter for Ms. Faith Smith to Jolee Ewing, [place].  [name] is scheduled for surgery on 08/13/2019.

## 2019-07-16 ENCOUNTER — Other Ambulatory Visit: Payer: Self-pay | Admitting: Family Medicine

## 2019-07-16 DIAGNOSIS — E782 Mixed hyperlipidemia: Secondary | ICD-10-CM

## 2019-07-16 DIAGNOSIS — E559 Vitamin D deficiency, unspecified: Secondary | ICD-10-CM

## 2019-07-16 MED ORDER — VITAMIN D (ERGOCALCIFEROL) 1.25 MG (50000 UNIT) PO CAPS: 50000.0000 [IU] | ORAL_CAPSULE | ORAL | 0 refills | Status: DC

## 2019-07-16 MED ORDER — PRAVASTATIN SODIUM 20 MG PO TABS: 20.0000 mg | ORAL_TABLET | Freq: Every day | ORAL | 1 refills | Status: DC

## 2019-07-22 ENCOUNTER — Encounter: Payer: Self-pay | Admitting: Family Medicine

## 2019-07-22 ENCOUNTER — Ambulatory Visit (INDEPENDENT_AMBULATORY_CARE_PROVIDER_SITE_OTHER): Payer: Medicaid Other | Admitting: Family Medicine

## 2019-07-22 ENCOUNTER — Other Ambulatory Visit: Payer: Self-pay

## 2019-07-22 VITALS — Ht 67.5 in | Wt 210.0 lb

## 2019-07-22 DIAGNOSIS — N3001 Acute cystitis with hematuria: Secondary | ICD-10-CM | POA: Diagnosis not present

## 2019-07-22 MED ORDER — CEPHALEXIN 500 MG PO CAPS: 500.0000 mg | ORAL_CAPSULE | Freq: Four times a day (QID) | ORAL | 0 refills | Status: AC

## 2019-07-22 MED ORDER — PHENAZOPYRIDINE HCL 200 MG PO TABS: 200.0000 mg | ORAL_TABLET | Freq: Three times a day (TID) | ORAL | 0 refills | Status: DC

## 2019-07-22 NOTE — Progress Notes (Signed)
Name: Faith Smith   MRN: [id number]    DOB: March 15, 1961   Date:07/22/2019       Progress Note  Subjective:    Chief Complaint  Chief Complaint  Patient presents with  . Urinary Tract Infection    syptoms started 11/11 , frequency and burning.    I connected with  Faith Smith  on 07/22/19 at  1:20 PM EST by a video enabled telemedicine application and verified that I am speaking with the correct person using two identifiers.  I discussed the limitations of evaluation and management by telemedicine and the availability of in person appointments. The patient expressed understanding and agreed to proceed. Staff also discussed with the patient that there may be a patient responsible charge related to this service. Patient Location: home Provider Location: [facility]  Additional Individuals present: spouse  Dysuria  This is a new problem. The current episode started yesterday. The problem occurs every urination. The problem has been unchanged. The quality of the pain is described as burning. The pain is at a severity of 9/10. There has been no fever. She is not sexually active. There is no history of pyelonephritis. Associated symptoms include hematuria, hesitancy and urgency. Pertinent negatives include no chills, discharge, flank pain, frequency, nausea, possible pregnancy, sweats or vomiting. She has tried nothing for the symptoms. Her past medical history is significant for recurrent UTIs. There is no history of catheterization, kidney stones, a single kidney, urinary stasis or a urological procedure.  Pt states she got a medicine and when she started taking it that when her sx started. pravastatin  She has associated bladder fullness, and immediately after urinating she feels like she has to go again, but she denies urinary frequency.     Patient Active Problem List   Diagnosis Date Noted  . Essential (hemorrhagic) thrombocythemia ([place]) 07/09/2019  . Asthma 04/09/2019  .  Uncontrolled type 2 diabetes mellitus with hyperglycemia, with long-term current use of insulin ([place]) 04/09/2019  . Obesity (BMI 30.0-34.9) 03/27/2017  . Proptosis 01/19/2016  . Stool incontinence 12/28/2015  . Osteoarthrosis multiple sites, not specified as generalized 04/14/2015  . Breast mass, left 04/14/2015  . Anxiety and depression 02/20/2015  . GERD (gastroesophageal reflux disease) 02/20/2015  . Hypercholesterolemia with hypertriglyceridemia 02/20/2015  . Arthralgia of multiple joints 02/20/2015  . Vitamin D deficiency 02/20/2015  . Insomnia 02/20/2015    Social History   Tobacco Use  . Smoking status: Current Every Day Smoker    Packs/day: 0.50    Years: 20.00    Pack years: 10.00    Types: Cigarettes    Start date: 03/20/1997    Last attempt to quit: 04/09/2018    Years since quitting: 1.2  . Smokeless tobacco: Never Used  . Tobacco comment: patient states she is working on it now  Substance Use Topics  . Alcohol use: No    Alcohol/week: 0.0 standard drinks     Current Outpatient Medications:  .  Accu-Chek FastClix Lancets MISC, 1 Device by Does not apply route 3 (three) times daily. Dx E11.65, LON 99 months, Disp: 100 each, Rfl: 5 .  albuterol (VENTOLIN HFA) 108 (90 Base) MCG/ACT inhaler, Inhale 2 puffs into the lungs every 6 (six) hours as needed for wheezing or shortness of breath., Disp: 8 g, Rfl: 0 .  Blood Gluc Meter Disp-Strips (BLOOD GLUCOSE METER DISPOSABLE) DEVI, 1 each by Does not apply route 3 (three) times daily before meals., Disp: 100 each, Rfl: 3 .  famotidine (PEPCID) 20 MG tablet, Take 1 tablet (20 mg total) by mouth 2 (two) times daily as needed for heartburn or indigestion., Disp: 180 tablet, Rfl: 1 .  glucose blood test strip, Check sugars 3x a day; LON 99 months, Dx E11.65, Disp: 100 each, Rfl: 5 .  HYDROcodone-acetaminophen (NORCO/VICODIN) 5-325 MG tablet, Take 1 tablet by mouth every 6 (six) hours as needed for moderate pain., Disp: 30 tablet,  Rfl: 0 .  Insulin Glargine (LANTUS SOLOSTAR) 100 UNIT/ML Solostar Pen, Inject 30 Units into the skin daily., Disp: 30 mL, Rfl: 0 .  Insulin Pen Needle 32G X 4 MM MISC, Use as directed with insulin daily, Disp: 100 each, Rfl: 1 .  JANUVIA 100 MG tablet, TAKE 1 TABLET BY MOUTH DAILY FOR DIABETES, Disp: 30 tablet, Rfl: 0 .  pravastatin (PRAVACHOL) 20 MG tablet, Take 1 tablet (20 mg total) by mouth at bedtime. For cholesterol, Disp: 90 tablet, Rfl: 1 .  traZODone (DESYREL) 100 MG tablet, Take 1 tablet (100 mg total) by mouth at bedtime as needed., Disp: 90 tablet, Rfl: 1 .  Vitamin D, Ergocalciferol, (DRISDOL) 1.25 MG (50000 UT) CAPS capsule, Take 1 capsule (50,000 Units total) by mouth every 7 (seven) days. x12 weeks., Disp: 12 capsule, Rfl: 0  Allergies  Allergen Reactions  . Shellfish Allergy   . Atorvastatin Other (See Comments)    Arthralgia  . Percocet [Oxycodone-Acetaminophen] Nausea And Vomiting    I personally reviewed medication list, allergies, notes from last encounter, lab results, other past UA/micro with the patient/caregiver today.  Review of Systems  Constitutional: Negative.  Negative for chills, diaphoresis, fever, malaise/fatigue and weight loss.  HENT: Negative.   Eyes: Negative.   Respiratory: Negative.   Cardiovascular: Negative.   Gastrointestinal: Negative.  Negative for abdominal pain, nausea and vomiting.  Genitourinary: Positive for dysuria, hematuria, hesitancy and urgency. Negative for flank pain and frequency.  Musculoskeletal: Negative.  Negative for falls and myalgias.  Skin: Negative.   Neurological: Negative.  Negative for dizziness and weakness.  Endo/Heme/Allergies: Negative.   Psychiatric/Behavioral: Negative.      Objective:   Virtual encounter, vitals limited, only able to obtain the following Today's Vitals   07/22/19 1318 07/22/19 1320  Weight: 210 lb (95.3 kg)   Height: 5' 7.5" (1.715 m)   PainSc:  9    Body mass index is 32.41 kg/m.  Nursing Note and Vital Signs reviewed.  Physical Exam Pt alert, well appearing, clear phonation No respiratory distress, retractions, audible wheeze or stridor Normal phonation No pallor, jaundice, erythema Normal mood and affect  PE limited by telephone encounter  No results found for this or any previous visit (from the past 72 hour(s)).  Assessment and Plan:     ICD-10-CM   1. Acute cystitis with hematuria  N30.01 cephALEXin (KEFLEX) 500 MG capsule    phenazopyridine (PYRIDIUM) 200 MG tablet  Patient with 2 days of urinary symptoms with burning and scant blood when she wipes we will treat for presumed UTI with Keflex and Pyridium, patient was encouraged to follow-up if not improving would want to see her in office if able to get urine dip, physical exam vital signs etc.  She is currently unable to get a ride to the office to do a urine sample, she is going to wait for her medicines to come through the mail.  Encouraged follow-up within 4 to 5 days, early next week on Monday or Tuesday if she is not feeling relief of her symptoms.  -  Red flags and when to present for emergency care or RTC including fever >101.88F, chest pain, shortness of breath, new/worsening/un-resolving symptoms,  reviewed with patient at time of visit. Follow up and care instructions discussed and provided in AVS. - I discussed the assessment and treatment plan with the patient. The patient was provided an opportunity to ask questions and all were answered. The patient agreed with the plan and demonstrated an understanding of the instructions.  I provided 15 minutes of non-face-to-face time during this encounter.  Delsa Grana, PA-C 07/22/19 1:45 PM

## 2019-08-04 ENCOUNTER — Other Ambulatory Visit: Payer: Self-pay | Admitting: Family Medicine

## 2019-08-04 DIAGNOSIS — M7662 Achilles tendinitis, left leg: Secondary | ICD-10-CM | POA: Diagnosis not present

## 2019-08-04 DIAGNOSIS — Z1159 Encounter for screening for other viral diseases: Secondary | ICD-10-CM | POA: Diagnosis not present

## 2019-08-10 ENCOUNTER — Telehealth: Payer: Self-pay | Admitting: *Deleted

## 2019-08-10 NOTE — Telephone Encounter (Signed)
"  Do you have a different number for Mrs. Vecellio.  We have tried her husband and her daughter's phone numbers with no luck."  Her mother's number is [phone] and her name is [place].  "I'll try that number.  We're trying to reach her to let her know what time to be here for her surgery.  She may be a no show.  We have tried numerous times to reach her."  I tried to call the patient as well.  She did not answer and I couldn't leave a message because her mailbox was full.

## 2019-08-10 NOTE — Telephone Encounter (Signed)
"  We have to cancel [name]'s surgery for Friday.  Pamala Hurry called the [place] [facility] to check on the status of Faith Smith Covid test.  It is positive for Covid.  They said they have been trying to contact the patient as well with no success."   I will let Dr. Amalia Hailey know.

## 2019-08-23 ENCOUNTER — Telehealth: Payer: Self-pay | Admitting: *Deleted

## 2019-08-23 NOTE — Telephone Encounter (Signed)
"  I was scheduled to have surgery on August 13, 2019.  I had to cancel it because they thought I had Covid.  But, come to find out I had symptoms from taking the Flu shot.  I went ahead and quarantined for 14 days anyway.  So, I want to go ahead and reschedule it."  Dr. Amalia Hailey doesn't have anything available until February 11 or 18, 2021.  "That's fine, put me down for February 11."  I'll get it scheduled.  "Can I ask you a question?"  Yes, you can.  "Will I have to get another Covid test?"  [facility] does not require it unless you are being intubated which, you probably will not be.  "Okay great, thank you so much for your help."   I called and asked Caren Griffins to reschedule [name]'s surgery to 10/21/2019.

## 2019-09-09 ENCOUNTER — Other Ambulatory Visit: Payer: Self-pay | Admitting: Family Medicine

## 2019-09-20 ENCOUNTER — Telehealth: Payer: Self-pay | Admitting: Podiatry

## 2019-09-20 NOTE — Telephone Encounter (Signed)
I'm scheduled for surgery in February and I forgot to give you my new phone number, so I was calling to give it to you. That number is [phone]. Thank you.

## 2019-09-27 ENCOUNTER — Encounter: Payer: Self-pay | Admitting: Podiatry

## 2019-10-11 ENCOUNTER — Ambulatory Visit: Payer: Medicaid Other | Admitting: Family Medicine

## 2019-10-12 ENCOUNTER — Telehealth: Payer: Self-pay | Admitting: *Deleted

## 2019-10-12 NOTE — Telephone Encounter (Signed)
I sent Ms. Macgregor an email asking her to give me a call.  I need to let her know what to do regarding getting a knee scooter that is needed for surgery.  I need to give her the [employer] number.  Caren Griffins at [facility] is trying to reach her as well.  I gave her the patient's new phone number.  Mammie Russian Tue 10/12/2019 12:54 PM Hello Ms. Kallin,  This is Amsi Grimley from [facility].  I have been trying to reach you.  Your phone is limiting calls.  The surgical center has been trying to reach you as well.  Please give me a call at [phone].  I need to give you some information regarding your surgery.  Best Regards, Karim Aiello

## 2019-10-13 NOTE — Telephone Encounter (Signed)
I made several attempts to reach the patient.  I spoke to Faith Smith and asked Faith to get Faith Smith to call me.  She said she has been trying to reach Faith as well.  I left a message on the voicemail of Faith Smith.  I asked Faith to give me a call.

## 2019-10-13 NOTE — Telephone Encounter (Signed)
I attempted to call the patient again.  She has calling restrictions on her phone.

## 2019-10-14 ENCOUNTER — Other Ambulatory Visit: Payer: Self-pay | Admitting: Family Medicine

## 2019-10-14 NOTE — Telephone Encounter (Signed)
Please schedule visit

## 2019-10-14 NOTE — Telephone Encounter (Signed)
Tried to call 2 different times. The phone would ring about 5 times then it would say "im sorry your call cannot be completed at this time". Prescription has been sent to the pharmacy however pcp is asking for pt to schedule an appointment.

## 2019-10-15 NOTE — Telephone Encounter (Signed)
Caren Griffins called and said she was wanting to know if we have been able to get in touch with pt about her COVID test. I told her you were still unable to reach her as of the other day. Caren Griffins said that they would move her to the end of the day on 02/11 but if she doesn't get the COVID test and we cannot reach her, that her surgery would be cancelled.

## 2019-10-18 ENCOUNTER — Telehealth: Payer: Self-pay | Admitting: *Deleted

## 2019-10-18 NOTE — Telephone Encounter (Signed)
"  I'm supposed to have surgery on Thursday.  My mom said you all have been trying to get in touch with me but my phone is off.  You can call me at [phone]."   I have been trying to reach you for the past two weeks.  Dr. Amalia Hailey wanted me to let you know you are going to need a knee scooter.  Your insurance will not cover the scooter.  "I already have a scooter."  Okay great, make sure you take it with you when you go for your surgery on Thursday.  "Okay, I will.  What time will I need to get there?"  Someone from the surgical center will give you a call in regards to your arrival time a day or two prior to your surgery date and will give you your arrival time.  The nurse has been trying to get in contact with you about you getting a Covid test.  You need to call them.  Their phone number is [phone].  "Okay, I'll give them a call."  I called and left a message for Caren Griffins at [facility].  I gave her Ms. Bonito's phone number.

## 2019-10-22 DIAGNOSIS — Z01818 Encounter for other preprocedural examination: Secondary | ICD-10-CM | POA: Diagnosis not present

## 2019-10-28 ENCOUNTER — Encounter: Payer: Self-pay | Admitting: Podiatry

## 2019-10-29 ENCOUNTER — Encounter: Payer: Medicaid Other | Admitting: Podiatry

## 2019-11-05 ENCOUNTER — Other Ambulatory Visit: Payer: Self-pay | Admitting: Family Medicine

## 2019-11-05 ENCOUNTER — Other Ambulatory Visit: Payer: Self-pay | Admitting: Podiatry

## 2019-11-05 ENCOUNTER — Encounter: Payer: Medicaid Other | Admitting: Podiatry

## 2019-11-05 DIAGNOSIS — Z794 Long term (current) use of insulin: Secondary | ICD-10-CM

## 2019-11-05 DIAGNOSIS — E1165 Type 2 diabetes mellitus with hyperglycemia: Secondary | ICD-10-CM

## 2019-11-05 DIAGNOSIS — M7732 Calcaneal spur, left foot: Secondary | ICD-10-CM | POA: Diagnosis not present

## 2019-11-05 DIAGNOSIS — X58XXXA Exposure to other specified factors, initial encounter: Secondary | ICD-10-CM | POA: Diagnosis not present

## 2019-11-05 DIAGNOSIS — Y939 Activity, unspecified: Secondary | ICD-10-CM | POA: Diagnosis not present

## 2019-11-05 DIAGNOSIS — Y999 Unspecified external cause status: Secondary | ICD-10-CM | POA: Diagnosis not present

## 2019-11-05 DIAGNOSIS — S86012A Strain of left Achilles tendon, initial encounter: Secondary | ICD-10-CM | POA: Diagnosis not present

## 2019-11-05 DIAGNOSIS — Y929 Unspecified place or not applicable: Secondary | ICD-10-CM | POA: Diagnosis not present

## 2019-11-05 DIAGNOSIS — M25572 Pain in left ankle and joints of left foot: Secondary | ICD-10-CM | POA: Diagnosis not present

## 2019-11-05 DIAGNOSIS — E7800 Pure hypercholesterolemia, unspecified: Secondary | ICD-10-CM | POA: Diagnosis not present

## 2019-11-05 DIAGNOSIS — M216X2 Other acquired deformities of left foot: Secondary | ICD-10-CM | POA: Diagnosis not present

## 2019-11-05 DIAGNOSIS — M7662 Achilles tendinitis, left leg: Secondary | ICD-10-CM

## 2019-11-05 HISTORY — PX: HEEL SPUR SURGERY: SHX665

## 2019-11-05 MED ORDER — HYDROCODONE-ACETAMINOPHEN 10-325 MG PO TABS: 1.0000 | ORAL_TABLET | ORAL | 0 refills | Status: AC | PRN

## 2019-11-05 NOTE — Progress Notes (Signed)
PRN postop 

## 2019-11-08 ENCOUNTER — Telehealth: Payer: Self-pay | Admitting: Podiatry

## 2019-11-08 NOTE — Telephone Encounter (Signed)
"   The medication the Doctor gave me after surgery is making me sick, its too strong."

## 2019-11-12 ENCOUNTER — Ambulatory Visit (INDEPENDENT_AMBULATORY_CARE_PROVIDER_SITE_OTHER): Payer: Medicaid Other

## 2019-11-12 ENCOUNTER — Ambulatory Visit (INDEPENDENT_AMBULATORY_CARE_PROVIDER_SITE_OTHER): Payer: Medicaid Other | Admitting: Family Medicine

## 2019-11-12 ENCOUNTER — Other Ambulatory Visit: Payer: Self-pay

## 2019-11-12 ENCOUNTER — Encounter: Payer: Self-pay | Admitting: Family Medicine

## 2019-11-12 ENCOUNTER — Encounter: Payer: Medicaid Other | Admitting: Podiatry

## 2019-11-12 ENCOUNTER — Ambulatory Visit: Payer: Medicaid Other

## 2019-11-12 ENCOUNTER — Ambulatory Visit (INDEPENDENT_AMBULATORY_CARE_PROVIDER_SITE_OTHER): Payer: Medicaid Other | Admitting: Podiatry

## 2019-11-12 ENCOUNTER — Encounter: Payer: Self-pay | Admitting: Podiatry

## 2019-11-12 VITALS — BP 106/63 | HR 87 | Temp 98.7°F

## 2019-11-12 VITALS — BP 118/64 | HR 78 | Temp 96.6°F | Resp 20 | Ht 67.5 in

## 2019-11-12 DIAGNOSIS — M216X2 Other acquired deformities of left foot: Secondary | ICD-10-CM

## 2019-11-12 DIAGNOSIS — E1165 Type 2 diabetes mellitus with hyperglycemia: Secondary | ICD-10-CM

## 2019-11-12 DIAGNOSIS — Z5181 Encounter for therapeutic drug level monitoring: Secondary | ICD-10-CM | POA: Diagnosis not present

## 2019-11-12 DIAGNOSIS — E782 Mixed hyperlipidemia: Secondary | ICD-10-CM

## 2019-11-12 DIAGNOSIS — Z23 Encounter for immunization: Secondary | ICD-10-CM | POA: Diagnosis not present

## 2019-11-12 DIAGNOSIS — E559 Vitamin D deficiency, unspecified: Secondary | ICD-10-CM | POA: Diagnosis not present

## 2019-11-12 DIAGNOSIS — Z1211 Encounter for screening for malignant neoplasm of colon: Secondary | ICD-10-CM | POA: Diagnosis not present

## 2019-11-12 DIAGNOSIS — Z9889 Other specified postprocedural states: Secondary | ICD-10-CM

## 2019-11-12 DIAGNOSIS — J4520 Mild intermittent asthma, uncomplicated: Secondary | ICD-10-CM | POA: Diagnosis not present

## 2019-11-12 DIAGNOSIS — G4700 Insomnia, unspecified: Secondary | ICD-10-CM | POA: Diagnosis not present

## 2019-11-12 DIAGNOSIS — D473 Essential (hemorrhagic) thrombocythemia: Secondary | ICD-10-CM | POA: Diagnosis not present

## 2019-11-12 DIAGNOSIS — Z794 Long term (current) use of insulin: Secondary | ICD-10-CM | POA: Diagnosis not present

## 2019-11-12 DIAGNOSIS — M7732 Calcaneal spur, left foot: Secondary | ICD-10-CM

## 2019-11-12 DIAGNOSIS — K219 Gastro-esophageal reflux disease without esophagitis: Secondary | ICD-10-CM

## 2019-11-12 DIAGNOSIS — Z7189 Other specified counseling: Secondary | ICD-10-CM

## 2019-11-12 DIAGNOSIS — Z7185 Encounter for immunization safety counseling: Secondary | ICD-10-CM

## 2019-11-12 MED ORDER — ONDANSETRON HCL 4 MG PO TABS: 4.0000 mg | ORAL_TABLET | Freq: Three times a day (TID) | ORAL | 0 refills | Status: DC | PRN

## 2019-11-12 MED ORDER — VITAMIN D (ERGOCALCIFEROL) 1.25 MG (50000 UNIT) PO CAPS: 50000.0000 [IU] | ORAL_CAPSULE | ORAL | 0 refills | Status: DC

## 2019-11-12 MED ORDER — SITAGLIPTIN PHOSPHATE 100 MG PO TABS: ORAL_TABLET | ORAL | 0 refills | Status: DC

## 2019-11-12 MED ORDER — PRAVASTATIN SODIUM 20 MG PO TABS: 20.0000 mg | ORAL_TABLET | Freq: Every day | ORAL | 1 refills | Status: DC

## 2019-11-12 MED ORDER — HYDROMORPHONE HCL 4 MG PO TABS: 4.0000 mg | ORAL_TABLET | ORAL | 0 refills | Status: DC | PRN

## 2019-11-12 MED ORDER — LANTUS SOLOSTAR 100 UNIT/ML ~~LOC~~ SOPN: 30.0000 [IU] | PEN_INJECTOR | Freq: Every day | SUBCUTANEOUS | 0 refills | Status: DC

## 2019-11-12 MED ORDER — TRAZODONE HCL 100 MG PO TABS: 100.0000 mg | ORAL_TABLET | Freq: Every evening | ORAL | 3 refills | Status: DC | PRN

## 2019-11-12 NOTE — Progress Notes (Signed)
Name: ROSSI BEECHLER   MRN: [id number]    DOB: 1960/12/26   Date:11/12/2019       Progress Note  Chief Complaint  Patient presents with  . Follow-up  . Medication Refill  . Diabetes  . Gastroesophageal Reflux  . Hyperlipidemia  . Insomnia     Subjective:   Faith Smith is a 59 y.o. female, presents to clinic for routine follow up on the conditions listed above.  Pt is here for routine f/up, she recently had surgery to her left heel - some of DM care will be deferred due to her recent surgery and upcoming rehab/pt.  She is in a good amount of pain.  She has ortho f/up in about an hour from now.  She is due for labs and needs medications refills  Hyperlipidemia: Current Medication Regimen:  Pravastatin only once weekly, she is scared of taking more than that Last Lipids: Lab Results  Component Value Date   CHOL 250 (H) 07/09/2019   HDL 31 (L) 07/09/2019   LDLCALC 178 (H) 07/09/2019   TRIG 248 (H) 07/09/2019   CHOLHDL 8.1 (H) 07/09/2019  - Current Diet:  Tries to eat healthy - Denies: Chest pain, shortness of breath, myalgias. - Risk factors for atherosclerosis: diabetes mellitus and hypercholesterolemia   Diabetes Mellitus Type II:  IDDM - improvement in A1C from significantly uncontrolled, a year ago was 13, last labs down to 7.9, but still not yet at goal Currently managing with 30 units lantus once daily and januvia sugarts running Pt notes good med compliance Pt has no SE from meds. Fasting CBGs typically run low 100's No hypoglycemic episodes Denies: Polyuria, polydipsia, polyphagia, vision changes, or neuropathy Recent pertinent labs: Lab Results  Component Value Date   HGBA1C 7.9 (H) 07/09/2019   HGBA1C 13.0 (A) 11/05/2018   HGBA1C 13.0 11/05/2018   HGBA1C 13.0 (A) 11/05/2018   HGBA1C 13.0 (A) 11/05/2018   Pt is due DM foot exam and eye exam - will need to defer until next visit due to surgery ACEI/ARB: No Statin: Yes  Insomnia - she is on trazodone  100 mg every day at bedtime - works well, no se or concerns  Vit D deficiency - 2018 was 12, 10/2018 8, 5 months ago improved up to 23 with rx, no recent supplements, recheck today Asthma, well controlled, only rarely uses albuterol inhaler, no recent exacerbations.  Reviewed her HM today, she is due for pneumococcal vaccine, overdue for colonoscopy she agrees to the referral but will likely need to defer any procedures out a few months.  She is also due for eye exam and foot exam we will do that with her next follow-up visit  We discussed at length cholesterol and preventative medicine and ASCVD risk score Also talked at length about the Covid vaccination risks and benefits, handout was given to her today  Patient Active Problem List   Diagnosis Date Noted  . Essential (hemorrhagic) thrombocythemia ([place]) 07/09/2019  . Asthma 04/09/2019  . Uncontrolled type 2 diabetes mellitus with hyperglycemia, with long-term current use of insulin ([place]) 04/09/2019  . Obesity (BMI 30.0-34.9) 03/27/2017  . Proptosis 01/19/2016  . Stool incontinence 12/28/2015  . Osteoarthrosis multiple sites, not specified as generalized 04/14/2015  . Breast mass, left 04/14/2015  . Anxiety and depression 02/20/2015  . GERD (gastroesophageal reflux disease) 02/20/2015  . Hypercholesterolemia with hypertriglyceridemia 02/20/2015  . Arthralgia of multiple joints 02/20/2015  . Vitamin D deficiency 02/20/2015  . Insomnia 02/20/2015  Past Surgical History:  Procedure Laterality Date  . BREAST BIOPSY Left 2015?   left  . [place]   Had 3  . CHOLECYSTECTOMY  1997  . HEEL SPUR SURGERY  11/05/2019    Family History  Problem Relation Age of Onset  . Hypertension Mother   . Cervical cancer Mother   . Depression Brother   . Stroke Father   . Ulcers Father   . Diabetes Maternal Grandmother   . Brain cancer Paternal Grandmother   . Hypertension Paternal Grandfather     Social History    Tobacco Use  . Smoking status: Current Every Day Smoker    Packs/day: 0.50    Years: 20.00    Pack years: 10.00    Types: Cigarettes    Start date: 03/20/1997    Last attempt to quit: 04/09/2018    Years since quitting: 1.5  . Smokeless tobacco: Never Used  . Tobacco comment: patient states she is working on it now  Substance Use Topics  . Alcohol use: No    Alcohol/week: 0.0 standard drinks  . Drug use: No      Current Outpatient Medications:  .  Accu-Chek FastClix Lancets MISC, 1 Device by Does not apply route 3 (three) times daily. Dx E11.65, LON 99 months, Disp: 100 each, Rfl: 5 .  albuterol (VENTOLIN HFA) 108 (90 Base) MCG/ACT inhaler, Inhale 2 puffs into the lungs every 6 (six) hours as needed for wheezing or shortness of breath., Disp: 8 g, Rfl: 0 .  Blood Gluc Meter Disp-Strips (BLOOD GLUCOSE METER DISPOSABLE) DEVI, 1 each by Does not apply route 3 (three) times daily before meals., Disp: 100 each, Rfl: 3 .  famotidine (PEPCID) 20 MG tablet, Take 1 tablet (20 mg total) by mouth 2 (two) times daily as needed for heartburn or indigestion., Disp: 180 tablet, Rfl: 1 .  glucose blood test strip, Check sugars 3x a day; LON 99 months, Dx E11.65, Disp: 100 each, Rfl: 5 .  Insulin Pen Needle 32G X 4 MM MISC, Use as directed with insulin daily, Disp: 100 each, Rfl: 1 .  JANUVIA 100 MG tablet, TAKE 1 TABLET BY MOUTH DAILY FOR DIABETES, Disp: 30 tablet, Rfl: 0 .  LANTUS SOLOSTAR 100 UNIT/ML Solostar Pen, INJECT 30 UNITS INTO THE SKIN DAILY, Disp: 15 mL, Rfl: 0 .  pravastatin (PRAVACHOL) 20 MG tablet, Take 1 tablet (20 mg total) by mouth at bedtime. For cholesterol, Disp: 90 tablet, Rfl: 1 .  traZODone (DESYREL) 100 MG tablet, Take 1 tablet (100 mg total) by mouth at bedtime as needed., Disp: 90 tablet, Rfl: 1 .  Vitamin D, Ergocalciferol, (DRISDOL) 1.25 MG (50000 UT) CAPS capsule, Take 1 capsule (50,000 Units total) by mouth every 7 (seven) days. x12 weeks., Disp: 12 capsule, Rfl: 0   Allergies  Allergen Reactions  . Shellfish Allergy   . Atorvastatin Other (See Comments)    Arthralgia  . Percocet [Oxycodone-Acetaminophen] Nausea And Vomiting    Chart Review Today: I personally reviewed active problem list, medication list, allergies, family history, social history, health maintenance, notes from last encounter, lab results, imaging with the patient/caregiver today.  Review of Systems  10 Systems reviewed and are negative for acute change except as noted in the HPI.    Objective:    Vitals:   11/12/19 0756  BP: 118/64  Pulse: 78  Resp: 20  Temp: (!) 96.6 F (35.9 C)  TempSrc: Temporal  SpO2: 98%  Height: 5'  7.5" (1.715 m)    Body mass index is 32.41 kg/m.  Physical Exam Vitals and nursing note reviewed.  Constitutional:      General: She is not in acute distress.    Appearance: Normal appearance. She is well-developed. She is obese. She is not ill-appearing, toxic-appearing or diaphoretic.     Interventions: Face mask in place.  HENT:     Head: Normocephalic and atraumatic.     Right Ear: External ear normal.     Left Ear: External ear normal.  Eyes:     General: Lids are normal. No scleral icterus.       Right eye: No discharge.        Left eye: No discharge.     Conjunctiva/sclera: Conjunctivae normal.  Neck:     Trachea: Phonation normal. No tracheal deviation.  Cardiovascular:     Rate and Rhythm: Normal rate and regular rhythm.     Pulses: Normal pulses.          Radial pulses are 2+ on the right side and 2+ on the left side.       Posterior tibial pulses are 2+ on the right side and 2+ on the left side.     Heart sounds: Normal heart sounds. No murmur. No friction rub. No gallop.   Pulmonary:     Effort: Pulmonary effort is normal. No respiratory distress.     Breath sounds: Normal breath sounds. No stridor. No wheezing, rhonchi or rales.  Chest:     Chest wall: No tenderness.  Abdominal:     General: Bowel sounds are normal.  There is no distension.     Palpations: Abdomen is soft.     Tenderness: There is no abdominal tenderness. There is no guarding or rebound.  Musculoskeletal:     Cervical back: Normal range of motion and neck supple.     Right lower leg: No edema.     Comments: Left lower leg and foot in cast, no edema to exposed left leg, no popliteal fossa ttp  Lymphadenopathy:     Cervical: No cervical adenopathy.  Skin:    General: Skin is warm and dry.     Capillary Refill: Capillary refill takes less than 2 seconds.     Coloration: Skin is not jaundiced or pale.     Findings: No rash.  Neurological:     Mental Status: She is alert.     Cranial Nerves: No dysarthria or facial asymmetry.     Motor: No abnormal muscle tone.     Comments: Normal coordination and movement of UE  Psychiatric:        Mood and Affect: Mood normal.        Speech: Speech normal.        Behavior: Behavior normal.        PHQ2/9: Depression screen [facility] 2/9 11/12/2019 07/22/2019 07/09/2019 04/09/2019 11/05/2018  Decreased Interest 0 0 0 0 0  Down, Depressed, Hopeless 0 0 0 0 0  PHQ - 2 Score 0 0 0 0 0  Altered sleeping 0 0 0 0 0  Tired, decreased energy 0 0 0 0 3  Change in appetite 0 0 0 0 3  Feeling bad or failure about yourself  0 0 0 0 0  Trouble concentrating 0 0 0 0 0  Moving slowly or fidgety/restless 0 0 0 0 0  Suicidal thoughts 0 0 0 0 0  PHQ-9 Score 0 0 0 0 6  Difficult doing work/chores  Not difficult at all - Not difficult at all Not difficult at all Somewhat difficult    phq 9 is neg, reviewed  Fall Risk: Fall Risk  11/12/2019 07/22/2019 07/09/2019 04/09/2019 11/05/2018  Falls in the past year? 1 0 0 0 0  Number falls in past yr: 0 0 0 0 0  Injury with Fall? 0 0 0 0 0    Functional Status Survey: Is the patient deaf or have difficulty hearing?: No Does the patient have difficulty seeing, even when wearing glasses/contacts?: No Does the patient have difficulty concentrating, remembering, or making  decisions?: No Does the patient have difficulty walking or climbing stairs?: Yes(becuase of surgery) Does the patient have difficulty dressing or bathing?: Yes(because of surgery) Does the patient have difficulty doing errands alone such as visiting a doctor's office or shopping?: Yes(because of surgery)   Assessment & Plan:     ICD-10-CM   1. Uncontrolled type 2 diabetes mellitus with hyperglycemia, with long-term current use of insulin (HCC)  E11.65 sitaGLIPtin (JANUVIA) 100 MG tablet   Z79.4 insulin glargine (LANTUS SOLOSTAR) 100 UNIT/ML Solostar Pen    COMPLETE METABOLIC PANEL WITH GFR    Lipid panel    Hemoglobin A1c   uncontrolled, but improving, IDDM compliant with meds, last A1C 7.9, no hypgolycemia, checking labs today, need foot and eye exam next visit  2. Hypercholesterolemia with hypertriglyceridemia  E78.2 pravastatin (PRAVACHOL) 20 MG tablet    COMPLETE METABOLIC PANEL WITH GFR    Lipid panel   statin hesitant, discussed ASCVD risk, statin meds, info given, recheck labs  3. Insomnia, unspecified type  G47.00 traZODone (DESYREL) 100 MG tablet   stable, meds still effective, no se or concerns  4. Vitamin D deficiency  E55.9 Vitamin D, Ergocalciferol, (DRISDOL) 1.25 MG (50000 UNIT) CAPS capsule    VITAMIN D 25 Hydroxy (Vit-D Deficiency, Fractures)   recheck  5. Gastroesophageal reflux disease, unspecified whether esophagitis present  K21.9    well controlled with pepcid BID prn  6. Essential (hemorrhagic) thrombocythemia ([place])  D47.3 CBC with Differential/Platelet   hx of, recheck platelets  7. Mild intermittent asthma without complication  [id number]    stable, well controlled with saba prn  8. Screening for malignant neoplasm of colon  Z12.11 Ambulatory referral to Gastroenterology  9. Need for 23-polyvalent pneumococcal polysaccharide vaccine  Z23 Pneumococcal polysaccharide vaccine 23-valent greater than or equal to 2yo subcutaneous/IM  10. Encounter for medication  monitoring  Z51.81 CBC with Differential/Platelet    COMPLETE METABOLIC PANEL WITH GFR    Lipid panel    Hemoglobin A1c  11. Vaccine counseling  Z71.89    spent more than 7 min today discussed risk vs benefit and data available on covid vaccines - hand out and resources given to pt   Greater than 50% of this visit was spent in direct face-to-face counseling, obtaining history and physical, discussing and educating pt on treatment plan, counseling re vaccines and also long discussion re statins and cholesterol management to decrease risk of CVD/events.  Total time of this visit was 40 min+.  Remainder of time involved but was not limited to reviewing chart (recent and pertinent OV notes and labs), documentation in EMR, and coordinating care and treatment plan.   Return in about 3 months (around 02/12/2020) for DM/HTN/HLD.   Delsa Grana, PA-C 11/12/19 8:19 AM

## 2019-11-12 NOTE — Patient Instructions (Signed)
COVID-19 Vaccine Information can be found at: [email or web address] For questions related to vaccine distribution or appointments, please email [email or web address] or call [phone].    

## 2019-11-13 LAB — COMPLETE METABOLIC PANEL WITH GFR
AG Ratio: 1.4 (calc) (ref 1.0–2.5)
ALT: 20 U/L (ref 6–29)
AST: 15 U/L (ref 10–35)
Albumin: 4 g/dL (ref 3.6–5.1)
Alkaline phosphatase (APISO): 90 U/L (ref 37–153)
BUN: 13 mg/dL (ref 7–25)
CO2: 26 mmol/L (ref 20–32)
Calcium: 10.1 mg/dL (ref 8.6–10.4)
Chloride: 104 mmol/L (ref 98–110)
Creat: 0.78 mg/dL (ref 0.50–0.99)
GFR, Est African American: 96 mL/min/{1.73_m2} (ref >=60)
GFR, Est Non African American: 83 mL/min/{1.73_m2} (ref >=60)
Globulin: 2.8 g/dL (calc) (ref 1.9–3.7)
Glucose, Bld: 204 mg/dL — ABNORMAL HIGH (ref 65–99)
Potassium: 3.8 mmol/L (ref 3.5–5.3)
Sodium: 140 mmol/L (ref 135–146)
Total Bilirubin: 0.3 mg/dL (ref 0.2–1.2)
Total Protein: 6.8 g/dL (ref 6.1–8.1)

## 2019-11-13 LAB — CBC WITH DIFFERENTIAL/PLATELET
Absolute Monocytes: 332 cells/uL (ref 200–950)
Basophils Absolute: 64 cells/uL (ref 0–200)
Basophils Relative: 0.6 %
Eosinophils Absolute: 310 cells/uL (ref 15–500)
Eosinophils Relative: 2.9 %
HCT: 37.6 % (ref 35.0–45.0)
Hemoglobin: 13 g/dL (ref 11.7–15.5)
Lymphs Abs: 2878 cells/uL (ref 850–3900)
MCH: 28.4 pg (ref 27.0–33.0)
MCHC: 34.6 g/dL (ref 32.0–36.0)
MCV: 82.1 fL (ref 80.0–100.0)
MPV: 9.6 fL (ref 7.5–12.5)
Monocytes Relative: 3.1 %
Neutro Abs: 7116 cells/uL (ref 1500–7800)
Neutrophils Relative %: 66.5 %
Platelets: 363 10*3/uL (ref 140–400)
RBC: 4.58 10*6/uL (ref 3.80–5.10)
RDW: 13.8 % (ref 11.0–15.0)
Total Lymphocyte: 26.9 %
WBC: 10.7 10*3/uL (ref 3.8–10.8)

## 2019-11-13 LAB — LIPID PANEL
Cholesterol: 240 mg/dL — ABNORMAL HIGH (ref <200)
HDL: 37 mg/dL — ABNORMAL LOW (ref >=50)
LDL Cholesterol (Calc): 165 mg/dL (calc) — ABNORMAL HIGH
Non-HDL Cholesterol (Calc): 203 mg/dL (calc) — ABNORMAL HIGH (ref <130)
Total CHOL/HDL Ratio: 6.5 (calc) — ABNORMAL HIGH (ref <5.0)
Triglycerides: 212 mg/dL — ABNORMAL HIGH (ref <150)

## 2019-11-13 LAB — VITAMIN D 25 HYDROXY (VIT D DEFICIENCY, FRACTURES): Vit D, 25-Hydroxy: 23 ng/mL — ABNORMAL LOW (ref 30–100)

## 2019-11-13 LAB — HEMOGLOBIN A1C
Hgb A1c MFr Bld: 8.6 %{Hb} — ABNORMAL HIGH
Mean Plasma Glucose: 200 (calc)
eAG (mmol/L): 11.1 (calc)

## 2019-11-15 ENCOUNTER — Other Ambulatory Visit: Payer: Self-pay | Admitting: Family Medicine

## 2019-11-15 DIAGNOSIS — K219 Gastro-esophageal reflux disease without esophagitis: Secondary | ICD-10-CM

## 2019-11-15 MED ORDER — FAMOTIDINE 20 MG PO TABS: 20.0000 mg | ORAL_TABLET | Freq: Two times a day (BID) | ORAL | 0 refills | Status: DC | PRN

## 2019-11-15 NOTE — Telephone Encounter (Signed)
Requested Prescriptions  Pending Prescriptions Disp Refills  . famotidine (PEPCID) 20 MG tablet 180 tablet 1    Sig: Take 1 tablet (20 mg total) by mouth 2 (two) times daily as needed for heartburn or indigestion.     Gastroenterology:  H2 Antagonists Passed - 11/15/2019  2:10 PM      Passed - Valid encounter within last 12 months    Recent Outpatient Visits          3 days ago Uncontrolled type 2 diabetes mellitus with hyperglycemia, with long-term current use of insulin [facility])   [facility] [place], Kristeen Miss, PA-C   3 months ago Acute cystitis with hematuria   [facility] Delsa Grana, PA-C   4 months ago Uncontrolled type 2 diabetes mellitus with hyperglycemia, with long-term current use of insulin [facility])   [facility] Delsa Grana, PA-C   7 months ago Uncontrolled type 2 diabetes mellitus with hyperglycemia, with long-term current use of insulin [facility])   [place], NP   1 year ago Diabetes mellitus type 2 in obese [facility])   [place], Satira Anis, MD      Future Appointments            In 2 months Delsa Grana, PA-C [facility]

## 2019-11-15 NOTE — Telephone Encounter (Signed)
Medication Refill - Medication:famotidine (PEPCID) 20 MG tablet    Has the patient contacted their pharmacy? Yes.   (Agent: If no, request that the patient contact the pharmacy for the refill.) (Agent: If yes, when and what did the pharmacy advise?)  Preferred Pharmacy (with phone number or street name): [place], [place] - [address], [place] [address] Phone:  [phone]  Fax:  [phone]      Agent: Please be advised that RX refills may take up to 3 business days. We ask that you follow-up with your pharmacy.

## 2019-11-15 NOTE — Progress Notes (Signed)
   Subjective:  Patient presents today status post posterior heel spur resection left and Achilles repair left. DOS: 11/05/2019. She states she is doing well. She reports minimal pain that is controlled with taking Advil. She states the prescribed pain medication caused nausea. She has been nonweightbearing with the cast intact without issue. She denies modifying factors. Patient is here for further evaluation and treatment.   Past Medical History:  Diagnosis Date  . Anxiety   . Arthritis   . Asthma 04/09/2019  . Blood transfusion without reported diagnosis    previously  . Depression   . Diabetes mellitus without complication ([place])   . Elevated platelet count 01/19/2016  . Essential (hemorrhagic) thrombocythemia ([place]) 07/09/2019  . GERD (gastroesophageal reflux disease)   . Hyperlipidemia   . Proptosis 01/19/2016      Objective/Physical Exam Neurovascular status intact. Cast left intact.   Radiographic Exam:  Osteotomies sites appear to be stable with routine healing.  Assessment: 1. s/p posterior heel spur resection left and Achilles repair left. DOS: 11/05/2019   Plan of Care:  1. Patient was evaluated. X-rays reviewed 2. Cast left intact.  3. Continue nonweightbearing with knee scooter.  4. Prescription for Dilaudid 4 mg #30 provided to patient.  5. Prescription for Zofran 4 mg #30 provided to patient.  6. Return to clinic in one week for cast removal.    Edrick Kins, DPM [facility]  Dr. Edrick Kins, [place]                                        [place], [place] [address]                Office [phone]  Fax [phone]

## 2019-11-16 ENCOUNTER — Other Ambulatory Visit: Payer: Self-pay | Admitting: Family Medicine

## 2019-11-16 DIAGNOSIS — E782 Mixed hyperlipidemia: Secondary | ICD-10-CM

## 2019-11-16 MED ORDER — PRAVASTATIN SODIUM 10 MG PO TABS: 10.0000 mg | ORAL_TABLET | Freq: Every day | ORAL | 3 refills | Status: DC

## 2019-11-16 MED ORDER — EZETIMIBE 10 MG PO TABS: 10.0000 mg | ORAL_TABLET | Freq: Every day | ORAL | 3 refills | Status: DC

## 2019-11-19 ENCOUNTER — Encounter: Payer: Self-pay | Admitting: Podiatry

## 2019-11-19 ENCOUNTER — Ambulatory Visit (INDEPENDENT_AMBULATORY_CARE_PROVIDER_SITE_OTHER): Payer: Medicaid Other | Admitting: Podiatry

## 2019-11-19 ENCOUNTER — Encounter: Payer: Medicaid Other | Admitting: Podiatry

## 2019-11-19 VITALS — Temp 97.6°F

## 2019-11-19 DIAGNOSIS — Z9889 Other specified postprocedural states: Secondary | ICD-10-CM

## 2019-11-19 DIAGNOSIS — M7732 Calcaneal spur, left foot: Secondary | ICD-10-CM

## 2019-11-22 NOTE — Progress Notes (Signed)
   Subjective:  Patient presents today status post posterior heel spur resection left and Achilles repair left. DOS: 11/05/2019. She states she is doing well and improving. She reports a decrease in the pain. She has been nonweightbearing with the cast intact without issue. There are no modifying factors noted. Patient is here for further evaluation and treatment.   Past Medical History:  Diagnosis Date  . Anxiety   . Arthritis   . Asthma 04/09/2019  . Blood transfusion without reported diagnosis    previously  . Depression   . Diabetes mellitus without complication ([place])   . Elevated platelet count 01/19/2016  . Essential (hemorrhagic) thrombocythemia ([place]) 07/09/2019  . GERD (gastroesophageal reflux disease)   . Hyperlipidemia   . Proptosis 01/19/2016      Objective/Physical Exam Neurovascular status intact.  Skin incisions appear to be well coapted with sutures and staples intact. No sign of infectious process noted. No dehiscence. No active bleeding noted. Moderate edema noted to the surgical extremity.  Assessment: 1. s/p posterior heel spur resection left and Achilles repair left. DOS: 11/05/2019   Plan of Care:  1. Patient was evaluated.  2. Cast removed today.  3. Dry sterile dressing applied.  4. Continue nonweightbearing in CAM boot with knee scooter.  5. Return to clinic in 2 weeks for suture removal.    Edrick Kins, DPM [facility]  Dr. Edrick Kins, [address]                                        [place], [place] [address]                Office [phone]  Fax [phone]

## 2019-11-26 ENCOUNTER — Encounter: Payer: Medicaid Other | Admitting: Podiatry

## 2019-12-03 ENCOUNTER — Encounter: Payer: Medicaid Other | Admitting: Podiatry

## 2019-12-13 ENCOUNTER — Other Ambulatory Visit: Payer: Self-pay | Admitting: Family Medicine

## 2019-12-13 DIAGNOSIS — E1165 Type 2 diabetes mellitus with hyperglycemia: Secondary | ICD-10-CM

## 2019-12-13 DIAGNOSIS — Z794 Long term (current) use of insulin: Secondary | ICD-10-CM

## 2019-12-16 ENCOUNTER — Telehealth: Payer: Self-pay | Admitting: Family Medicine

## 2019-12-16 NOTE — Telephone Encounter (Signed)
Pt called stating that she is not able to receive her medication from the pharmacy because her insurance will not cover it. Pt was told to contact PCP to get this taken care of. Pt states that she has been out of medication for 3 days. Please advise.      [place]  [place], [place] [address]  Phone: [phone] Fax: [phone]  Not a 24 hour pharmacy; exact hours not known.

## 2019-12-16 NOTE — Telephone Encounter (Signed)
Tried to call pt back unable to leave vm or get a hold of pt.  Prior Josem Kaufmann was submitted yesterday.  Waiting to hear back from ins

## 2019-12-17 ENCOUNTER — Ambulatory Visit (INDEPENDENT_AMBULATORY_CARE_PROVIDER_SITE_OTHER): Payer: Medicaid Other | Admitting: Podiatry

## 2019-12-17 ENCOUNTER — Other Ambulatory Visit: Payer: Self-pay

## 2019-12-17 DIAGNOSIS — Z9889 Other specified postprocedural states: Secondary | ICD-10-CM

## 2019-12-17 DIAGNOSIS — M7662 Achilles tendinitis, left leg: Secondary | ICD-10-CM

## 2019-12-17 DIAGNOSIS — M7732 Calcaneal spur, left foot: Secondary | ICD-10-CM

## 2019-12-20 NOTE — Progress Notes (Signed)
   Subjective:  Patient presents today status post posterior heel spur resection left and Achilles repair left. DOS: 11/05/2019. She states she is improving. She denies any pain or worsening factors. She has been elevating the foot and has been nonweightbearing in the CAM boot as directed. Patient is here for further evaluation and treatment.   Past Medical History:  Diagnosis Date  . Anxiety   . Arthritis   . Asthma 04/09/2019  . Blood transfusion without reported diagnosis    previously  . Depression   . Diabetes mellitus without complication ([place])   . Elevated platelet count 01/19/2016  . Essential (hemorrhagic) thrombocythemia ([place]) 07/09/2019  . GERD (gastroesophageal reflux disease)   . Hyperlipidemia   . Proptosis 01/19/2016      Objective/Physical Exam Neurovascular status intact.  Skin incisions appear to be well coapted with sutures and staples intact. No sign of infectious process noted. No dehiscence. No active bleeding noted. Moderate edema noted to the surgical extremity.  Assessment: 1. s/p posterior heel spur resection left and Achilles repair left. DOS: 11/05/2019   Plan of Care:  1. Patient was evaluated.  2. Sutures removed.  3. Begin weightbearing in CAM boot.  4. Discontinue using knee scooter.  5. Return to clinic in 4 weeks.    Edrick Kins, DPM [facility]  Dr. Edrick Kins, [address]                                        [place], [place] [address]                Office [phone]  Fax [phone]

## 2020-01-05 ENCOUNTER — Other Ambulatory Visit: Payer: Self-pay | Admitting: Family Medicine

## 2020-01-05 DIAGNOSIS — G4700 Insomnia, unspecified: Secondary | ICD-10-CM

## 2020-01-05 NOTE — Telephone Encounter (Signed)
Medication Refill - Medication: traZODone (DESYREL) 100 MG tablet    Has the patient contacted their pharmacy? Yes.   (Agent: If no, request that the patient contact the pharmacy for the refill.) (Agent: If yes, when and what did the pharmacy advise?)  Preferred Pharmacy (with phone number or street name):  [place], [place] - [place], [place] [address] Phone:  [phone]  Fax:  [phone]       Agent: Please be advised that RX refills may take up to 3 business days. We ask that you follow-up with your pharmacy.

## 2020-01-07 ENCOUNTER — Ambulatory Visit: Payer: Medicaid Other | Attending: Internal Medicine

## 2020-01-11 ENCOUNTER — Other Ambulatory Visit: Payer: Self-pay | Admitting: Family Medicine

## 2020-01-11 DIAGNOSIS — E1165 Type 2 diabetes mellitus with hyperglycemia: Secondary | ICD-10-CM

## 2020-01-11 MED ORDER — INSULIN PEN NEEDLE 32G X 4 MM MISC: 1 refills | Status: DC

## 2020-01-11 NOTE — Telephone Encounter (Signed)
Copied from [place] [id number]. Topic: Quick Communication - Rx Refill/Question >> Jan 11, 2020  1:38 PM [place] A wrote: Medication: Insulin Needles  Has the patient contacted their pharmacy? yes (Agent: If no, request that the patient contact the pharmacy for the refill.) (Agent: If yes, when and what did the pharmacy advise?)Contact PCP  Preferred Pharmacy (with phone number or street name): [place], [place] - [place], [address]  Phone:  [phone] Fax:  [phone]     Agent: Please be advised that RX refills may take up to 3 business days. We ask that you follow-up with your pharmacy.

## 2020-01-14 ENCOUNTER — Encounter: Payer: Medicaid Other | Admitting: Podiatry

## 2020-01-28 ENCOUNTER — Encounter: Payer: Self-pay | Admitting: Podiatry

## 2020-01-28 ENCOUNTER — Other Ambulatory Visit: Payer: Self-pay

## 2020-01-28 ENCOUNTER — Ambulatory Visit (INDEPENDENT_AMBULATORY_CARE_PROVIDER_SITE_OTHER): Payer: Medicaid Other | Admitting: Podiatry

## 2020-01-28 DIAGNOSIS — Z9889 Other specified postprocedural states: Secondary | ICD-10-CM

## 2020-01-28 DIAGNOSIS — M7662 Achilles tendinitis, left leg: Secondary | ICD-10-CM

## 2020-01-31 NOTE — Progress Notes (Signed)
   Subjective:  Patient presents today status post posterior heel spur resection left and Achilles repair left. DOS: 11/05/2019. She states she has improved but still experiences intermittent pain and swelling of the posterior heel. She has been taking Tylenol for treatment with minimal relief. She has been using the CAM boot as directed. Patient is here for further evaluation and treatment.   Past Medical History:  Diagnosis Date  . Anxiety   . Arthritis   . Asthma 04/09/2019  . Blood transfusion without reported diagnosis    previously  . Depression   . Diabetes mellitus without complication ([place])   . Elevated platelet count 01/19/2016  . Essential (hemorrhagic) thrombocythemia ([place]) 07/09/2019  . GERD (gastroesophageal reflux disease)   . Hyperlipidemia   . Proptosis 01/19/2016      Objective/Physical Exam Neurovascular status intact.  Skin incisions appear to be well coapted. No sign of infectious process noted. No dehiscence. No active bleeding noted. Moderate edema noted to the surgical extremity.  Assessment: 1. s/p posterior heel spur resection left and Achilles repair left. DOS: 11/05/2019   Plan of Care:  1. Patient was evaluated.  2. Discontinue using CAM boot. Recommended good shoe gear.  3. Physical therapy ordered from [facility].  4. Return to clinic in 6 weeks.    Edrick Kins, DPM [facility]  Dr. Edrick Kins, [place]                                        [place], [place] [address]                Office [phone]  Fax [phone]

## 2020-02-08 ENCOUNTER — Other Ambulatory Visit: Payer: Self-pay | Admitting: Family Medicine

## 2020-02-08 DIAGNOSIS — E1165 Type 2 diabetes mellitus with hyperglycemia: Secondary | ICD-10-CM

## 2020-02-08 NOTE — Telephone Encounter (Signed)
insulin glargine (LANTUS SOLOSTAR) 100 UNIT/ML Solostar Pen  Please refill per pt request. Pt states out of insulin 3 days. Has appt on 02/11/20     Send to [place], [place] - [place], [place] [address] Phone:  [phone]  Fax:  [phone]

## 2020-02-11 ENCOUNTER — Ambulatory Visit: Payer: Medicaid Other | Admitting: Family Medicine

## 2020-02-15 NOTE — Progress Notes (Signed)
This encounter was created in error - please disregard.

## 2020-03-09 DIAGNOSIS — Z419 Encounter for procedure for purposes other than remedying health state, unspecified: Secondary | ICD-10-CM | POA: Diagnosis not present

## 2020-03-10 ENCOUNTER — Encounter: Payer: Medicaid Other | Admitting: Podiatry

## 2020-03-13 IMAGING — DX DG KNEE COMPLETE 4+V*L*
4 series · 4 of 4 positions shown · non-contrast
Comparison: None

CLINICAL DATA: Generalized LEFT knee pain and swelling today, no
injury

EXAM:
LEFT KNEE - COMPLETE 4+ VIEW

[knee ap]
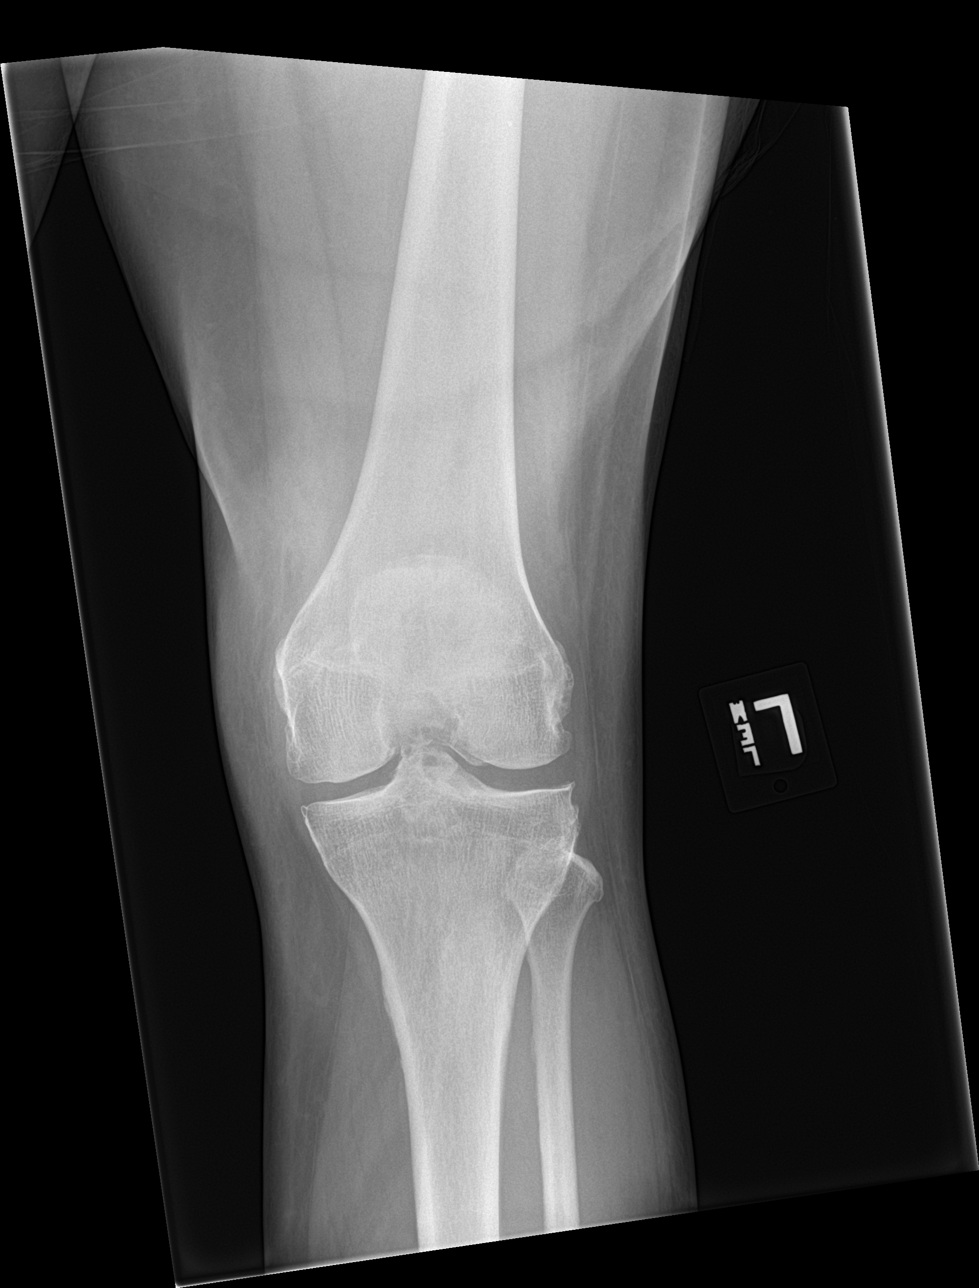

[knee lat]
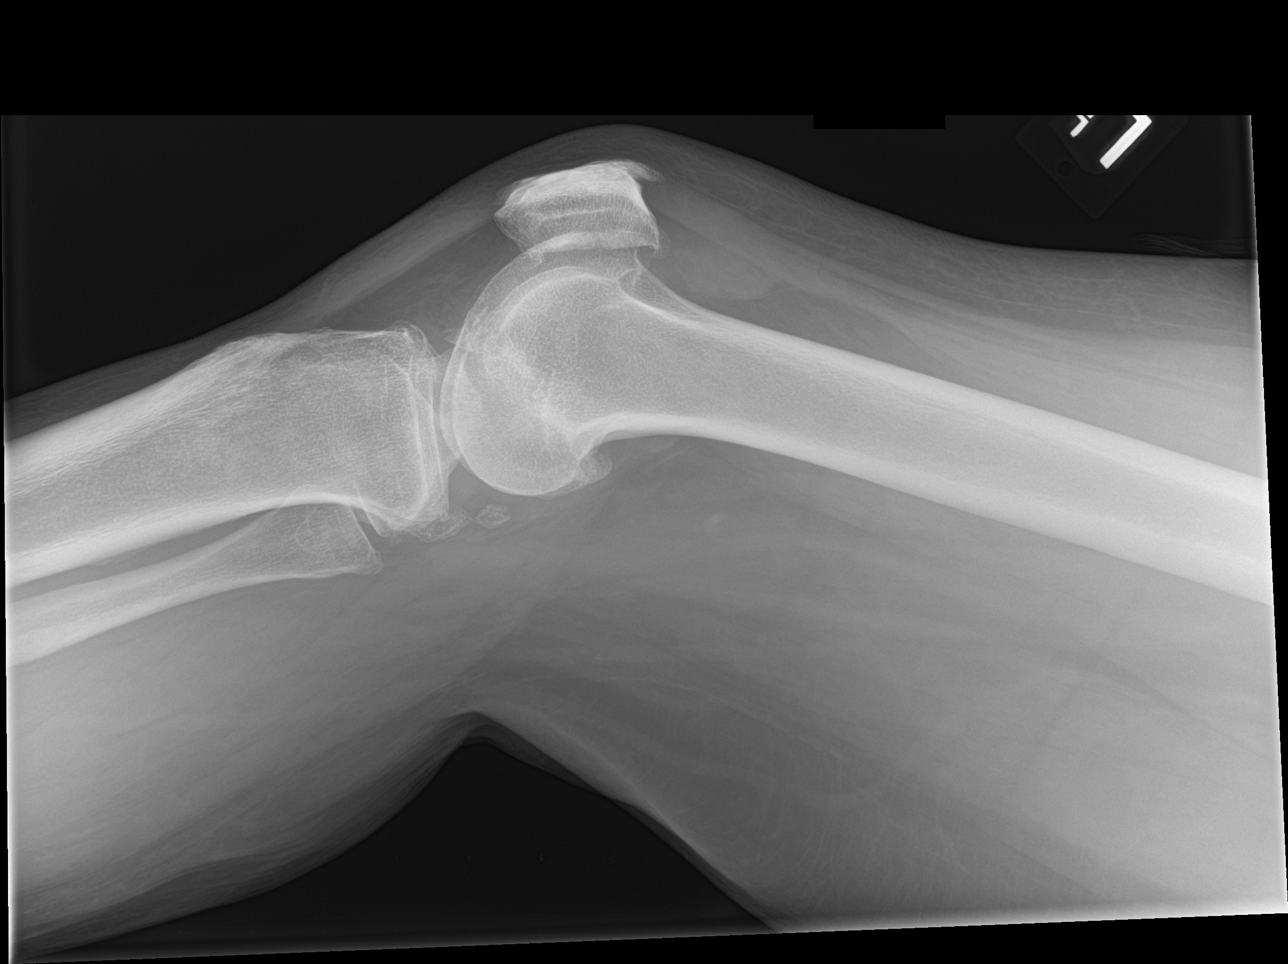

[knee obl (1 of 2)]
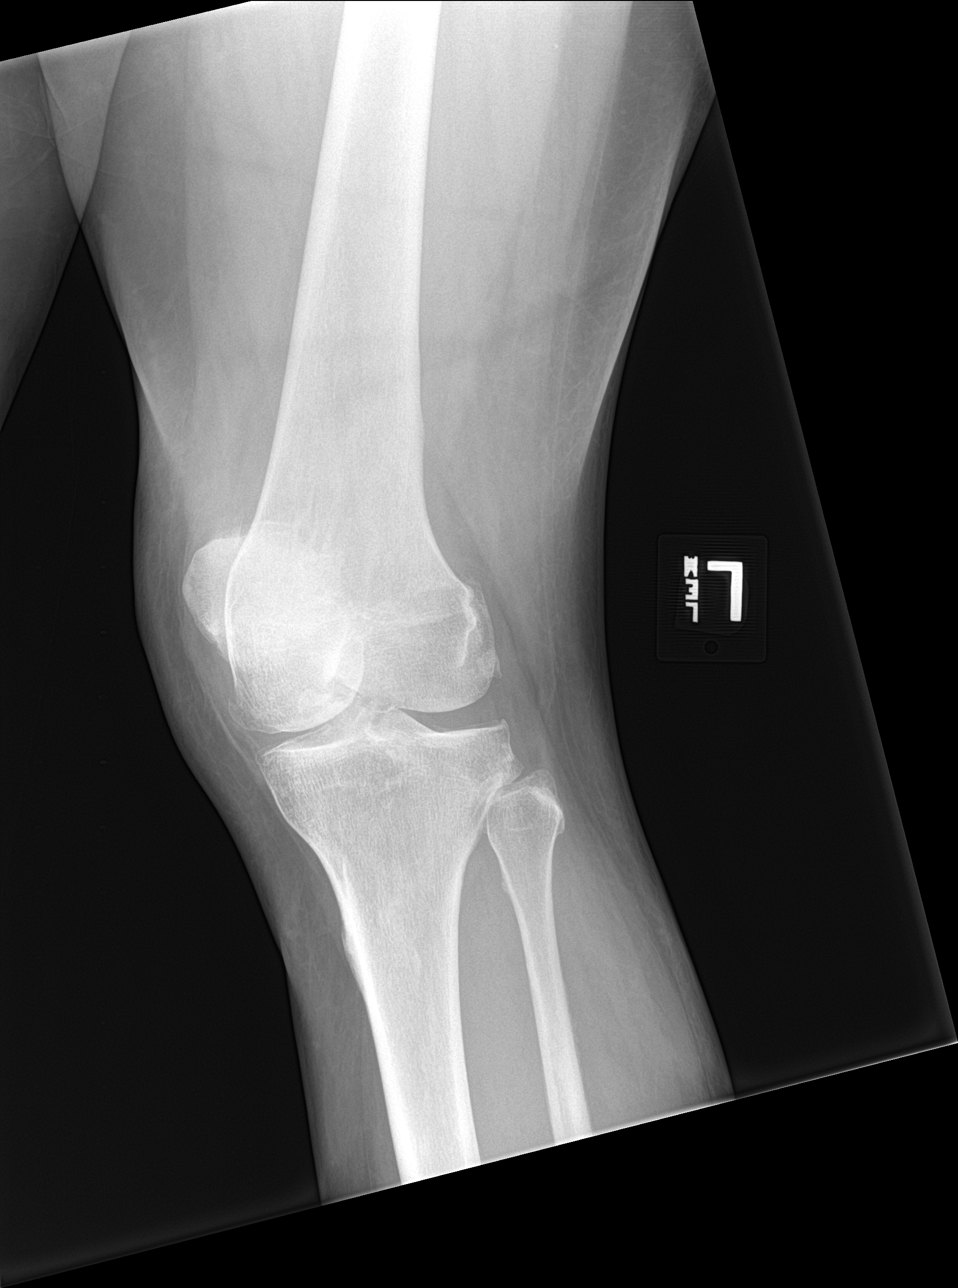

[knee obl (2 of 2)]
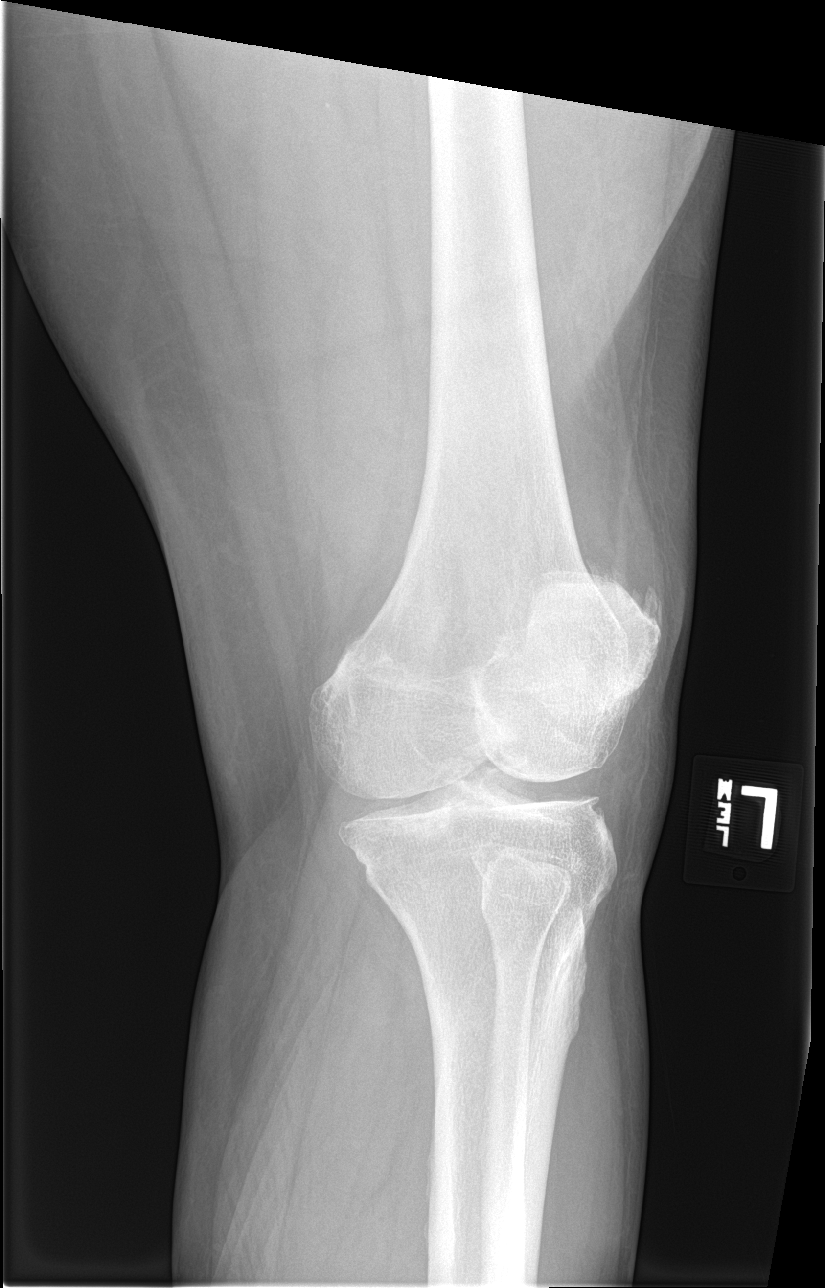

[4 of 4 positions shown; findings below may reference images not displayed]

FINDINGS: Osseous demineralization.

Patellofemoral joint space narrowing and spur formation.

Patellar spur at quadriceps tendon insertion.

Small joint effusion.

No acute fracture, dislocation, or bone destruction.
IMPRESSION: Degenerative changes LEFT knee and small joint effusion.

No acute osseous abnormalities.

## 2020-04-07 ENCOUNTER — Other Ambulatory Visit: Payer: Self-pay | Admitting: Family Medicine

## 2020-04-07 DIAGNOSIS — Z794 Long term (current) use of insulin: Secondary | ICD-10-CM

## 2020-04-09 DIAGNOSIS — Z419 Encounter for procedure for purposes other than remedying health state, unspecified: Secondary | ICD-10-CM | POA: Diagnosis not present

## 2020-05-05 ENCOUNTER — Other Ambulatory Visit: Payer: Self-pay | Admitting: Family Medicine

## 2020-05-05 DIAGNOSIS — K219 Gastro-esophageal reflux disease without esophagitis: Secondary | ICD-10-CM

## 2020-05-05 NOTE — Telephone Encounter (Signed)
Pt missed 3 month f/up in June -  Multiple uncontrolled conditions, no appt scheduled now 5 months later, will ask front office to call pt to come in for routine OV ASAP - will need in person for exam and labs

## 2020-05-08 NOTE — Progress Notes (Signed)
Name: Faith Smith   MRN: [id number]    DOB: 04/22/1961   Date:05/09/2020       Progress Note  Subjective  Chief Complaint  Chief Complaint  Patient presents with  . Vaginitis    Patient could not get a ride to come into the office today, she is having vaginal burning and itching and white discharge, onset Thursday 05/04/20, she has not tried any yeast OTC meds   . Follow-up    needs medications refilled    I connected with  Tawanna Cooler on 05/09/20 at  9:40 AM EDT by telephone and verified that I am speaking with the correct person using two identifiers.  I discussed the limitations, risks, security and privacy concerns of performing an evaluation and management service by telephone and the availability of in person appointments. The patient expressed understanding and agreed to proceed. Staff also discussed with the patient that there may be a patient responsible charge related to this service. Patient Location: Home Provider Location: [facility] Additional Individuals present: none  HPI  Patient is a 59 year old female patient of Delsa Grana Last visit with her was 11/12/2019 Labs done after that visit were reviewed by [facility] and the communication to her was as follows:  Please call pt with lab results: Kidney, liver function normal, no anemia seen A1C is high - up to 8.6 - we will need to add a medication or increase insulin to get to goal of 7.0 -she may be able to improve with decreasing sugar/carbs/starch in diet and overall reducing calories. cholesterol is very high and her risk is also very high. In the next 10 years her risk of having a heart attack or stroke at almost 20% - 1 in 5 Strongly encourage her to try the statin for more days per week - I sent in lower dose pravastatin - have her try 10 mg every other day, I also sent in zetia - can help lower cholesterol and works with statin to lower ASCVD risk - if she tolerates 10 mg every other day then I would want her to try  daily - and f/up here for OV 3 months after starting med Vit D was also a little low still - take the Rx and when done start 2000 IU daily Follows up today for medication refill and yeast infection concern  She noted she did not increase her insulin after that last visit in March, nor did she add any medication to help with better blood sugar control.  She noted last Thursday, she started to have some vaginal discomfort, with a whitish type discharge noted over the next few days, and this persists.  She has not tried any over-the-counter products for this, and in the past, was prescribed a pill which was helpful.  She has not had a yeast infection in the very recent past noted. I noted concerns that her sugars are likely higher if this is a yeast infection, and she noted that she does think her sugars have been running higher than normal.  Her machine is broken has not been able to check.  She states she has had diabetes for a long time, and can tell when her sugars are running high.  She still takes the Lantus at nighttime, 30 units and a Januvia product during the day.  She stated she does not feel that bad, as can occur when her sugars get real high, with no recent fevers noted.  She denies any marked  pain with urination, no flank pains.  She also requested a refill of her albuterol inhaler, notes she uses that very rarely, and estimated uses once every couple months.  Her blood sugars have been a little better controlled in the recent past, with an A1c in February 2020 noted to be 13.  More recently, not very well controlled.  She also has a hyperlipidemia history. Lab Results  Component Value Date   HGBA1C 8.6 (H) 11/12/2019   HGBA1C 7.9 (H) 07/09/2019   HGBA1C 13.0 (A) 11/05/2018   HGBA1C 13.0 11/05/2018   HGBA1C 13.0 (A) 11/05/2018   HGBA1C 13.0 (A) 11/05/2018   Lab Results  Component Value Date   MICROALBUR 2.6 11/05/2018   LDLCALC 165 (H) 11/12/2019   CREATININE 0.78 11/12/2019        Patient Active Problem List   Diagnosis Date Noted  . Essential (hemorrhagic) thrombocythemia ([place]) 07/09/2019  . Asthma 04/09/2019  . Uncontrolled type 2 diabetes mellitus with hyperglycemia, with long-term current use of insulin ([place]) 04/09/2019  . Obesity (BMI 30.0-34.9) 03/27/2017  . Proptosis 01/19/2016  . Stool incontinence 12/28/2015  . Osteoarthrosis multiple sites, not specified as generalized 04/14/2015  . Breast mass, left 04/14/2015  . Anxiety and depression 02/20/2015  . GERD (gastroesophageal reflux disease) 02/20/2015  . Hypercholesterolemia with hypertriglyceridemia 02/20/2015  . Arthralgia of multiple joints 02/20/2015  . Vitamin D deficiency 02/20/2015  . Insomnia 02/20/2015    Past Surgical History:  Procedure Laterality Date  . BREAST BIOPSY Left 2015?   left  . [place]   Had 3  . CHOLECYSTECTOMY  1997  . HEEL SPUR SURGERY  11/05/2019    Family History  Problem Relation Age of Onset  . Hypertension Mother   . Cervical cancer Mother   . Depression Brother   . Stroke Father   . Ulcers Father   . Diabetes Maternal Grandmother   . Brain cancer Paternal Grandmother   . Hypertension Paternal Grandfather     Social History   Tobacco Use  . Smoking status: Current Every Day Smoker    Packs/day: 0.50    Years: 20.00    Pack years: 10.00    Types: Cigarettes    Start date: 03/20/1997    Last attempt to quit: 04/09/2018    Years since quitting: 2.0  . Smokeless tobacco: Never Used  . Tobacco comment: patient states she is working on it now  Substance Use Topics  . Alcohol use: No    Alcohol/week: 0.0 standard drinks     Current Outpatient Medications:  .  albuterol (VENTOLIN HFA) 108 (90 Base) MCG/ACT inhaler, Inhale 2 puffs into the lungs every 6 (six) hours as needed for wheezing or shortness of breath., Disp: 8 g, Rfl: 0 .  famotidine (PEPCID) 20 MG tablet, TAKE 1 TABLET BY MOUTH TWICE DAILY AS NEEDED FOR   HEARTBURN  OR  INDIGESTION, Disp: 180 tablet, Rfl: 1 .  Insulin Pen Needle 32G X 4 MM MISC, Use as directed with insulin daily, Disp: 100 each, Rfl: 1 .  LANTUS SOLOSTAR 100 UNIT/ML Solostar Pen, INJECT 30 UNITS SUBCUTANEOUSLY ONCE DAILY, Disp: 15 mL, Rfl: 0 .  sitaGLIPtin (JANUVIA) 100 MG tablet, TAKE 1 TABLET BY MOUTH ONCE DAILY FOR DIABETES, Disp: 90 tablet, Rfl: 1 .  traZODone (DESYREL) 100 MG tablet, Take 1 tablet (100 mg total) by mouth at bedtime as needed., Disp: 90 tablet, Rfl: 3 .  ezetimibe (ZETIA) 10 MG tablet, Take 1 tablet (  10 mg total) by mouth daily. (Patient not taking: Reported on 05/09/2020), Disp: 90 tablet, Rfl: 3 .  pravastatin (PRAVACHOL) 10 MG tablet, Take 1 tablet (10 mg total) by mouth daily. (Patient not taking: Reported on 05/09/2020), Disp: 90 tablet, Rfl: 3  Allergies  Allergen Reactions  . Shellfish Allergy   . Atorvastatin Other (See Comments)    Arthralgia  . Percocet [Oxycodone-Acetaminophen] Nausea And Vomiting    With staff assistance, above reviewed with the patient  today.  ROS: As per HPI, otherwise no specific complaints on a limited and focused system review   Objective  Virtual encounter, vitals not obtained.  There is no height or weight on file to calculate BMI.  Physical Exam   Appears in NAD via conversation Breathing: No obvious respiratory distress. Speaking in complete sentences Neurological: Pt is alert, Speech is normal Psychiatric: Patient has a normal mood and affect, Judgment and thought content normal.   No results found for this or any previous visit (from the past 72 hour(s)).  PHQ2/9: Depression screen [facility] 2/9 05/09/2020 11/12/2019 07/22/2019 07/09/2019 04/09/2019  Decreased Interest 0 0 0 0 0  Down, Depressed, Hopeless 0 0 0 0 0  PHQ - 2 Score 0 0 0 0 0  Altered sleeping - 0 0 0 0  Tired, decreased energy - 0 0 0 0  Change in appetite - 0 0 0 0  Feeling bad or failure about yourself  - 0 0 0 0  Trouble concentrating - 0  0 0 0  Moving slowly or fidgety/restless - 0 0 0 0  Suicidal thoughts - 0 0 0 0  PHQ-9 Score - 0 0 0 0  Difficult doing work/chores - Not difficult at all - Not difficult at all Not difficult at all   PHQ-2/9 Result reviewed  Fall Risk: Fall Risk  05/09/2020 11/12/2019 07/22/2019 07/09/2019 04/09/2019  Falls in the past year? 0 1 0 0 0  Number falls in past yr: 0 0 0 0 0  Injury with Fall? 0 0 0 0 0  Follow up Falls evaluation completed - - - -     Assessment & Plan  1. Mild intermittent asthma without complication Okay to refill her albuterol inhaler today. Her asthma has been well controlled, with only rare use of this inhaler noted. - albuterol (VENTOLIN HFA) 108 (90 Base) MCG/ACT inhaler; Inhale 2 puffs into the lungs every 6 (six) hours as needed for wheezing or shortness of breath.  Dispense: 8 g; Refill: 0  2. Uncontrolled type 2 diabetes mellitus with hyperglycemia, with long-term current use of insulin (HCC) Has not had great control of her blood sugars in the recent past, and concerned with the yeast infection that her sugars have been running higher. Remains on the insulin at nighttime, and a Januvia product in the daytime. She noted her glucometer broke, and does not have a way to check her sugars at home presently. Will order a blood glucose meter kit and supplies for the kit today, as do feel having her check her sugars at home is important. Noted she needs a follow-up appointment with Kristeen Miss again to help get better control of her blood sugars and better management of her diabetes she was in agreement with that.  Asked that a follow-up be scheduled to do so.  - blood glucose meter kit and supplies KIT; Dispense based on patient and insurance preference. Use up to four times daily as directed. (FOR ICD-9 250.00, 250.01).  Dispense: 1  each; Refill: 0  3. Yeast infection of the vagina We will start a Diflucan product for probable yeast infection, and has had that in the past  as well. Noted if her symptoms do not improve over a few days, or if significantly worsening, she will need a follow-up visit in person to help further assess.  - fluconazole (DIFLUCAN) 150 MG tablet; Take 1 tablet (150 mg total) by mouth once for 1 dose.  Dispense: 2 tablet; Refill: 0   I discussed the assessment and treatment plan with the patient. The patient was provided an opportunity to ask questions and all were answered. The patient agreed with the plan and demonstrated an understanding of the instructions.  Red flags and when to present for emergency care or RTC including fevers , new/worsening/un-resolving symptoms reviewed with patient at time of visit.   The patient was advised to call back or seek an in-person evaluation if the symptoms worsen or if the condition fails to improve as anticipated.  I provided 20 minutes of non-face-to-face time during this encounter that included discussing at length patient's sx/history, pertinent pmhx, medications, treatment and follow up plan. This time also included the necessary documentation, orders, and chart review.  Towanda Malkin, MD

## 2020-05-08 NOTE — Telephone Encounter (Signed)
Looks as if pt already has an appointment scheduled with Dr Roxan Hockey for tomorrow

## 2020-05-09 ENCOUNTER — Ambulatory Visit (INDEPENDENT_AMBULATORY_CARE_PROVIDER_SITE_OTHER): Payer: Medicaid Other | Admitting: Internal Medicine

## 2020-05-09 DIAGNOSIS — B373 Candidiasis of vulva and vagina: Secondary | ICD-10-CM

## 2020-05-09 DIAGNOSIS — Z794 Long term (current) use of insulin: Secondary | ICD-10-CM

## 2020-05-09 DIAGNOSIS — B3731 Acute candidiasis of vulva and vagina: Secondary | ICD-10-CM | POA: Insufficient documentation

## 2020-05-09 DIAGNOSIS — E1165 Type 2 diabetes mellitus with hyperglycemia: Secondary | ICD-10-CM

## 2020-05-09 DIAGNOSIS — J4520 Mild intermittent asthma, uncomplicated: Secondary | ICD-10-CM | POA: Diagnosis not present

## 2020-05-09 MED ORDER — FLUCONAZOLE 150 MG PO TABS: 150.0000 mg | ORAL_TABLET | Freq: Once | ORAL | 0 refills | Status: AC

## 2020-05-09 MED ORDER — ALBUTEROL SULFATE HFA 108 (90 BASE) MCG/ACT IN AERS: 2.0000 | INHALATION_SPRAY | Freq: Four times a day (QID) | RESPIRATORY_TRACT | 0 refills | Status: DC | PRN

## 2020-05-09 MED ORDER — BLOOD GLUCOSE MONITOR KIT: PACK | 0 refills | Status: DC

## 2020-05-10 ENCOUNTER — Telehealth: Payer: Self-pay

## 2020-05-10 DIAGNOSIS — Z419 Encounter for procedure for purposes other than remedying health state, unspecified: Secondary | ICD-10-CM | POA: Diagnosis not present

## 2020-05-10 NOTE — Telephone Encounter (Addendum)
Patient calling checking on the status of message below, patient would like a follow up call today when completed. Please send blood glucose meter kit and supplies KIT and pins   [place], [place] - [place], [address] Phone:  [phone]  Fax:  [phone]

## 2020-05-10 NOTE — Telephone Encounter (Signed)
Copied from [place] [id number]. Topic: General - Inquiry >> May 09, 2020  3:50 PM Greggory Keen D wrote: Reason for CRM: Pt called saying [facility] had her other rx's today but did not have her blood glucose meter and pins.  [facility] [place]  CB#  [phone]

## 2020-05-10 NOTE — Telephone Encounter (Signed)
Faith Smith called the pharmacy at 4:25 pm on 05-10-2020 to call the order in and she also re faxed it.

## 2020-05-23 ENCOUNTER — Other Ambulatory Visit: Payer: Self-pay | Admitting: Family Medicine

## 2020-05-23 DIAGNOSIS — Z794 Long term (current) use of insulin: Secondary | ICD-10-CM

## 2020-06-09 DIAGNOSIS — Z419 Encounter for procedure for purposes other than remedying health state, unspecified: Secondary | ICD-10-CM | POA: Diagnosis not present

## 2020-07-04 ENCOUNTER — Telehealth: Payer: Self-pay | Admitting: Family Medicine

## 2020-07-04 DIAGNOSIS — E1165 Type 2 diabetes mellitus with hyperglycemia: Secondary | ICD-10-CM

## 2020-07-06 NOTE — Telephone Encounter (Signed)
Patient must be seen.

## 2020-07-09 ENCOUNTER — Other Ambulatory Visit: Payer: Self-pay | Admitting: Family Medicine

## 2020-07-09 DIAGNOSIS — E1165 Type 2 diabetes mellitus with hyperglycemia: Secondary | ICD-10-CM

## 2020-07-10 DIAGNOSIS — Z419 Encounter for procedure for purposes other than remedying health state, unspecified: Secondary | ICD-10-CM | POA: Diagnosis not present

## 2020-07-10 NOTE — Telephone Encounter (Signed)
Pt called about refill request/ Advised pt she needs an appt/ pt scheduled a virtual for tomorrow /pt doesn't have transportation to come in office/

## 2020-07-11 ENCOUNTER — Other Ambulatory Visit: Payer: Self-pay

## 2020-07-11 ENCOUNTER — Ambulatory Visit (INDEPENDENT_AMBULATORY_CARE_PROVIDER_SITE_OTHER): Payer: Medicaid Other | Admitting: Family Medicine

## 2020-07-11 ENCOUNTER — Encounter: Payer: Self-pay | Admitting: Family Medicine

## 2020-07-11 DIAGNOSIS — E1165 Type 2 diabetes mellitus with hyperglycemia: Secondary | ICD-10-CM

## 2020-07-11 DIAGNOSIS — E669 Obesity, unspecified: Secondary | ICD-10-CM | POA: Diagnosis not present

## 2020-07-11 DIAGNOSIS — Z114 Encounter for screening for human immunodeficiency virus [HIV]: Secondary | ICD-10-CM | POA: Diagnosis not present

## 2020-07-11 DIAGNOSIS — Z5181 Encounter for therapeutic drug level monitoring: Secondary | ICD-10-CM | POA: Diagnosis not present

## 2020-07-11 DIAGNOSIS — D473 Essential (hemorrhagic) thrombocythemia: Secondary | ICD-10-CM | POA: Diagnosis not present

## 2020-07-11 DIAGNOSIS — J4520 Mild intermittent asthma, uncomplicated: Secondary | ICD-10-CM

## 2020-07-11 DIAGNOSIS — E782 Mixed hyperlipidemia: Secondary | ICD-10-CM | POA: Diagnosis not present

## 2020-07-11 DIAGNOSIS — Z794 Long term (current) use of insulin: Secondary | ICD-10-CM

## 2020-07-11 MED ORDER — ALBUTEROL SULFATE HFA 108 (90 BASE) MCG/ACT IN AERS: 2.0000 | INHALATION_SPRAY | Freq: Four times a day (QID) | RESPIRATORY_TRACT | 2 refills | Status: DC | PRN

## 2020-07-11 NOTE — Progress Notes (Signed)
Name: Faith Smith   MRN: [id number]    DOB: 1960/10/30   Date:07/11/2020       Progress Note  Subjective:    I connected with  Faith Smith  on 07/11/20 at  9:20 AM EDT by a video enabled telemedicine application and verified that I am speaking with the correct person using two identifiers.  I discussed the limitations of evaluation and management by telemedicine and the availability of in person appointments. The patient expressed understanding and agreed to proceed. Staff also discussed with the patient that there may be a patient responsible charge related to this service. Patient Location: home Provider Location: [facility] clinic Additional Individuals present:    Chief Complaint  Patient presents with  . Diabetes  . Hyperlipidemia  . Asthma    inhaler refill    Faith Smith is a 59 y.o. female, presents for virtual visit for routine follow up on the conditions listed above.  HLD - not taking pravastatin, but taking zetia daily  DM:  IDDM - 30 units lantus at bedtime Blood sugars 120-200 Denies: Polyuria, polydipsia, vision changes, neuropathy, hypoglycemia Recent pertinent labs: Lab Results  Component Value Date   HGBA1C 8.6 (H) 11/12/2019   HGBA1C 7.9 (H) 07/09/2019   HGBA1C 13.0 (A) 11/05/2018   HGBA1C 13.0 11/05/2018   HGBA1C 13.0 (A) 11/05/2018   HGBA1C 13.0 (A) 11/05/2018   Standard of care and health maintenance: Urine Microalbumin:  due Foot exam:  due DM eye exam:  due ACEI/ARB:  None? Statin:  Not taking  HTN - not on any BP meds or ACEI/ARB  Asthma - needs a refill on her inhaler - only managed on rescue inhaler, does tend to have more asthma sx with weather changes like recently.   No nighttime waking, recently using albuterol about 2x a day   Patient Active Problem List   Diagnosis Date Noted  . Yeast infection of the vagina 05/09/2020  . Essential (hemorrhagic) thrombocythemia ([place]) 07/09/2019  . Asthma 04/09/2019  . Uncontrolled type 2  diabetes mellitus with hyperglycemia, with long-term current use of insulin ([place]) 04/09/2019  . Obesity (BMI 30.0-34.9) 03/27/2017  . Proptosis 01/19/2016  . Stool incontinence 12/28/2015  . Osteoarthrosis multiple sites, not specified as generalized 04/14/2015  . Breast mass, left 04/14/2015  . Anxiety and depression 02/20/2015  . GERD (gastroesophageal reflux disease) 02/20/2015  . Hypercholesterolemia with hypertriglyceridemia 02/20/2015  . Arthralgia of multiple joints 02/20/2015  . Vitamin D deficiency 02/20/2015  . Insomnia 02/20/2015    Current Outpatient Medications:  .  albuterol (VENTOLIN HFA) 108 (90 Base) MCG/ACT inhaler, Inhale 2 puffs into the lungs every 6 (six) hours as needed for wheezing or shortness of breath., Disp: 8 g, Rfl: 0 .  ezetimibe (ZETIA) 10 MG tablet, Take 1 tablet (10 mg total) by mouth daily., Disp: 90 tablet, Rfl: 3 .  famotidine (PEPCID) 20 MG tablet, TAKE 1 TABLET BY MOUTH TWICE DAILY AS NEEDED FOR  HEARTBURN  OR  INDIGESTION, Disp: 180 tablet, Rfl: 1 .  LANTUS SOLOSTAR 100 UNIT/ML Solostar Pen, INJECT 30 UNITS SUBCUTANEOUSLY ONCE DAILY, Disp: 15 mL, Rfl: 3 .  sitaGLIPtin (JANUVIA) 100 MG tablet, TAKE 1 TABLET BY MOUTH ONCE DAILY FOR DIABETES, Disp: 90 tablet, Rfl: 1 .  traZODone (DESYREL) 100 MG tablet, Take 1 tablet (100 mg total) by mouth at bedtime as needed., Disp: 90 tablet, Rfl: 3 .  blood glucose meter kit and supplies KIT, Dispense based on patient and insurance preference.  Use up to four times daily as directed. (FOR ICD-9 250.00, 250.01). (Patient not taking: Reported on 07/11/2020), Disp: 1 each, Rfl: 0 .  Insulin Pen Needle 32G X 4 MM MISC, Use as directed with insulin daily (Patient not taking: Reported on 07/11/2020), Disp: 100 each, Rfl: 1 Allergies  Allergen Reactions  . Shellfish Allergy   . Atorvastatin Other (See Comments)    Arthralgia  . Percocet [Oxycodone-Acetaminophen] Nausea And Vomiting    Past Surgical History:  Procedure  Laterality Date  . BREAST BIOPSY Left 2015?   left  . [place]   Had 3  . CHOLECYSTECTOMY  1997  . HEEL SPUR SURGERY  11/05/2019   Family History  Problem Relation Age of Onset  . Hypertension Mother   . Cervical cancer Mother   . Depression Brother   . Stroke Father   . Ulcers Father   . Diabetes Maternal Grandmother   . Brain cancer Paternal Grandmother   . Hypertension Paternal Grandfather    Social History   Socioeconomic History  . Marital status: Married    Spouse name: Not on file  . Number of children: 7  . Years of education: Not on file  . Highest education level: Not on file  Occupational History  . Not on file  Tobacco Use  . Smoking status: Current Every Day Smoker    Packs/day: 0.50    Years: 20.00    Pack years: 10.00    Types: Cigarettes    Start date: 03/20/1997    Last attempt to quit: 04/09/2018    Years since quitting: 2.2  . Smokeless tobacco: Never Used  . Tobacco comment: patient states she is working on it now  Media planner  . Vaping Use: Never used  Substance and Sexual Activity  . Alcohol use: No    Alcohol/week: 0.0 standard drinks  . Drug use: No  . Sexual activity: Yes    Partners: Male    Birth control/protection: Post-menopausal  Other Topics Concern  . Not on file  Social History Narrative  . Not on file   Social Determinants of Health   Financial Resource Strain:   . Difficulty of Paying Living Expenses: Not on file  Food Insecurity:   . Worried About Charity fundraiser in the Last Year: Not on file  . Ran Out of Food in the Last Year: Not on file  Transportation Needs:   . Lack of Transportation (Medical): Not on file  . Lack of Transportation (Non-Medical): Not on file  Physical Activity:   . Days of Exercise per Week: Not on file  . Minutes of Exercise per Session: Not on file  Stress:   . Feeling of Stress : Not on file  Social Connections:   . Frequency of Communication with Friends and  Family: Not on file  . Frequency of Social Gatherings with Friends and Family: Not on file  . Attends Religious Services: Not on file  . Active Member of Clubs or Organizations: Not on file  . Attends Archivist Meetings: Not on file  . Marital Status: Not on file  Intimate Partner Violence:   . Fear of Current or Ex-Partner: Not on file  . Emotionally Abused: Not on file  . Physically Abused: Not on file  . Sexually Abused: Not on file    Chart Review Today: I personally reviewed active problem list, medication list, allergies, family history, social history, health maintenance, notes from last  encounter, lab results, imaging with the patient/caregiver today.   Review of Systems  10 Systems reviewed and are negative for acute change except as noted in the HPI.   Objective:    Virtual encounter, vitals limited, only able to obtain the following There were no vitals filed for this visit. There is no height or weight on file to calculate BMI. Nursing Note and Vital Signs reviewed.  Physical Exam Vitals and nursing note reviewed.  Pulmonary:     Effort: No respiratory distress.     Comments: No audible wheeze, speaking in full and complete sentences Neurological:     Mental Status: She is alert.     PE limited by telephone encounter  No results found for this or any previous visit (from the past 72 hour(s)).  PHQ2/9: Depression screen [facility] 2/9 07/11/2020 05/09/2020 11/12/2019 07/22/2019 07/09/2019  Decreased Interest 0 0 0 0 0  Down, Depressed, Hopeless 0 0 0 0 0  PHQ - 2 Score 0 0 0 0 0  Altered sleeping - - 0 0 0  Tired, decreased energy - - 0 0 0  Change in appetite - - 0 0 0  Feeling bad or failure about yourself  - - 0 0 0  Trouble concentrating - - 0 0 0  Moving slowly or fidgety/restless - - 0 0 0  Suicidal thoughts - - 0 0 0  PHQ-9 Score - - 0 0 0  Difficult doing work/chores - - Not difficult at all - Not difficult at all   PHQ-2/9 Result is  neg  Fall Risk: Fall Risk  07/11/2020 05/09/2020 11/12/2019 07/22/2019 07/09/2019  Falls in the past year? 0 0 1 0 0  Number falls in past yr: 0 0 0 0 0  Injury with Fall? 0 0 0 0 0  Follow up Falls evaluation completed Falls evaluation completed - - -     Assessment and Plan:     ICD-10-CM   1. Mild intermittent asthma without complication  W10.93 albuterol (VENTOLIN HFA) 108 (90 Base) MCG/ACT inhaler   worse sx with weather changes, refill on albuterol  2. Uncontrolled type 2 diabetes mellitus with hyperglycemia, with long-term current use of insulin (HCC)  A35.57 COMPLETE METABOLIC PANEL WITH GFR   Z79.4 Hemoglobin A1c   uncontrolled, needs to come in person I explained to pt, no labs x 8 months, continue januvia and lantus, needs labs and close f/up visit to adjust insulin  3. Hypercholesterolemia with hypertriglyceridemia  D22.0 COMPLETE METABOLIC PANEL WITH GFR    Lipid panel   not on statin, on zetia, encouraged diet efforts  4. Essential (hemorrhagic) thrombocythemia ([place])  D47.3 CBC with Differential/Platelet  5. Obesity (BMI 30.0-34.9)  E66.9   6. Screening for HIV without presence of risk factors  Z11.4 HIV Antibody (routine testing w rflx)  7. Medication monitoring encounter  Z51.81 CBC with Differential/Platelet    COMPLETE METABOLIC PANEL WITH GFR    Lipid panel    Hemoglobin A1c    Microalbumin, urine    Multiple uncontrolled conditions and not on meds standard of care for DM I explained to pt that with IDDM we need her to come in every 3 months and explained that there are medications she should be on to help keep her healthy - ie ACE/ARB to protect her kidneys Pt has not tolerated lipitor and pravastatin in the past - only on zetia Due for all HM care for DM and for preventative care   I discussed  the assessment and treatment plan with the patient. The patient was provided an opportunity to ask questions and all were answered. The patient agreed with the plan and  demonstrated an understanding of the instructions.  The patient was advised to call back or seek an in-person evaluation if the symptoms worsen or if the condition fails to improve as anticipated.  I provided 30+ minutes of non-face-to-face time during this encounter.  Delsa Grana, PA-C 07/11/20 9:54 AM

## 2020-08-09 DIAGNOSIS — Z419 Encounter for procedure for purposes other than remedying health state, unspecified: Secondary | ICD-10-CM | POA: Diagnosis not present

## 2020-09-06 ENCOUNTER — Ambulatory Visit: Payer: Medicaid Other | Admitting: Family Medicine

## 2020-09-09 DIAGNOSIS — Z419 Encounter for procedure for purposes other than remedying health state, unspecified: Secondary | ICD-10-CM | POA: Diagnosis not present

## 2020-10-10 DIAGNOSIS — Z419 Encounter for procedure for purposes other than remedying health state, unspecified: Secondary | ICD-10-CM | POA: Diagnosis not present

## 2020-11-07 DIAGNOSIS — Z419 Encounter for procedure for purposes other than remedying health state, unspecified: Secondary | ICD-10-CM | POA: Diagnosis not present

## 2020-12-08 DIAGNOSIS — Z419 Encounter for procedure for purposes other than remedying health state, unspecified: Secondary | ICD-10-CM | POA: Diagnosis not present

## 2020-12-20 ENCOUNTER — Other Ambulatory Visit: Payer: Self-pay | Admitting: Family Medicine

## 2020-12-20 DIAGNOSIS — G4700 Insomnia, unspecified: Secondary | ICD-10-CM

## 2020-12-20 DIAGNOSIS — K219 Gastro-esophageal reflux disease without esophagitis: Secondary | ICD-10-CM

## 2020-12-20 NOTE — Telephone Encounter (Signed)
Staff asked to contact pt - last labs 11/2019, last OV 8/21 and 11/21 - virtual, labs ordered but not completed. Please let the pt know she needs to complete the ordered labs or some meds I will not be able to refill in the future

## 2020-12-29 ENCOUNTER — Other Ambulatory Visit: Payer: Self-pay | Admitting: Family Medicine

## 2020-12-29 DIAGNOSIS — Z794 Long term (current) use of insulin: Secondary | ICD-10-CM

## 2020-12-29 DIAGNOSIS — E1165 Type 2 diabetes mellitus with hyperglycemia: Secondary | ICD-10-CM

## 2021-01-01 NOTE — Telephone Encounter (Signed)
Called pt to schedule appt but phone said that the person you are dialing can not talk your call at this time and could not leave a message. Pt is needing appt per Lucio Edward

## 2021-01-07 DIAGNOSIS — Z419 Encounter for procedure for purposes other than remedying health state, unspecified: Secondary | ICD-10-CM | POA: Diagnosis not present

## 2021-02-07 DIAGNOSIS — Z419 Encounter for procedure for purposes other than remedying health state, unspecified: Secondary | ICD-10-CM | POA: Diagnosis not present

## 2021-03-09 DIAGNOSIS — Z419 Encounter for procedure for purposes other than remedying health state, unspecified: Secondary | ICD-10-CM | POA: Diagnosis not present

## 2021-03-20 ENCOUNTER — Other Ambulatory Visit: Payer: Self-pay | Admitting: Family Medicine

## 2021-03-20 DIAGNOSIS — Z794 Long term (current) use of insulin: Secondary | ICD-10-CM

## 2021-03-20 DIAGNOSIS — E1165 Type 2 diabetes mellitus with hyperglycemia: Secondary | ICD-10-CM

## 2021-03-21 ENCOUNTER — Other Ambulatory Visit: Payer: Self-pay | Admitting: Family Medicine

## 2021-03-21 DIAGNOSIS — E1165 Type 2 diabetes mellitus with hyperglycemia: Secondary | ICD-10-CM

## 2021-03-21 DIAGNOSIS — G4700 Insomnia, unspecified: Secondary | ICD-10-CM

## 2021-03-21 DIAGNOSIS — Z794 Long term (current) use of insulin: Secondary | ICD-10-CM

## 2021-03-21 MED ORDER — TRAZODONE HCL 100 MG PO TABS: 100.0000 mg | ORAL_TABLET | Freq: Every evening | ORAL | 0 refills | Status: DC | PRN

## 2021-03-21 MED ORDER — SITAGLIPTIN PHOSPHATE 100 MG PO TABS
ORAL_TABLET | ORAL | 0 refills | Status: DC
Start: 2021-03-21 — End: 2021-03-23

## 2021-03-21 NOTE — Telephone Encounter (Signed)
Medication Refill - Medication:  traZODone (DESYREL) 100 MG tablet  albuterol (VENTOLIN HFA) 108 (90 Base) MCG/ACT inhaler sitaGLIPtin (JANUVIA) 100 MG tablet    Has the patient contacted their pharmacy? Yes.  Pharmacy stated no refills on file  Preferred Pharmacy (with phone number or street name):  [place], [place] - [place], [address]  Phone:  [phone] Fax:  [phone]  Agent: Please be advised that RX refills may take up to 3 business days. We ask that you follow-up with your pharmacy.

## 2021-03-21 NOTE — Telephone Encounter (Signed)
Courtesy refill given, appointment needed.

## 2021-03-21 NOTE — Telephone Encounter (Signed)
Patient called on number listed, recording no longer in service. Called husband's listed number, left VM for patient to call the office. She will need to schedule an OV with Leisa for CPE, labs.

## 2021-03-22 ENCOUNTER — Other Ambulatory Visit: Payer: Self-pay

## 2021-03-22 DIAGNOSIS — K219 Gastro-esophageal reflux disease without esophagitis: Secondary | ICD-10-CM

## 2021-03-23 ENCOUNTER — Other Ambulatory Visit: Payer: Self-pay

## 2021-03-23 DIAGNOSIS — Z794 Long term (current) use of insulin: Secondary | ICD-10-CM

## 2021-03-23 DIAGNOSIS — G4700 Insomnia, unspecified: Secondary | ICD-10-CM

## 2021-03-23 DIAGNOSIS — K219 Gastro-esophageal reflux disease without esophagitis: Secondary | ICD-10-CM

## 2021-03-23 DIAGNOSIS — E1165 Type 2 diabetes mellitus with hyperglycemia: Secondary | ICD-10-CM

## 2021-03-26 MED ORDER — FAMOTIDINE 20 MG PO TABS: ORAL_TABLET | ORAL | 0 refills | Status: DC

## 2021-03-26 MED ORDER — SITAGLIPTIN PHOSPHATE 100 MG PO TABS: ORAL_TABLET | ORAL | 0 refills | Status: DC

## 2021-03-26 MED ORDER — TRAZODONE HCL 100 MG PO TABS: 100.0000 mg | ORAL_TABLET | Freq: Every evening | ORAL | 0 refills | Status: DC | PRN

## 2021-03-27 NOTE — Telephone Encounter (Signed)
Not able to inform pt that prescription has been sent to pharmacy and for her to schedule appt wrong number

## 2021-04-09 DIAGNOSIS — Z419 Encounter for procedure for purposes other than remedying health state, unspecified: Secondary | ICD-10-CM | POA: Diagnosis not present

## 2021-05-10 DIAGNOSIS — Z419 Encounter for procedure for purposes other than remedying health state, unspecified: Secondary | ICD-10-CM | POA: Diagnosis not present

## 2021-05-23 ENCOUNTER — Telehealth: Payer: Self-pay | Admitting: Family Medicine

## 2021-05-23 DIAGNOSIS — E1165 Type 2 diabetes mellitus with hyperglycemia: Secondary | ICD-10-CM

## 2021-05-23 DIAGNOSIS — Z794 Long term (current) use of insulin: Secondary | ICD-10-CM

## 2021-05-23 DIAGNOSIS — G4700 Insomnia, unspecified: Secondary | ICD-10-CM

## 2021-05-23 NOTE — Telephone Encounter (Signed)
Requested medication (s) are due for refill today:   Yes  Requested medication (s) are on the active medication list:   Yes  Future visit scheduled:   No unable to contact pt due to wrong number.    Last ordered: Both medications 03/26/2021 with no refills.  Returned because unable to contact pt.  Had 2 courtesy refills already   Requested Prescriptions  Pending Prescriptions Disp Refills   traZODone (DESYREL) 100 MG tablet [Pharmacy Med Name: traZODone HCl 100 MG Oral Tablet] 30 tablet 0    Sig: TAKE 1 TABLET BY MOUTH AT BEDTIME AS NEEDED     Psychiatry: Antidepressants - Serotonin Modulator Failed - 05/23/2021 12:02 PM      Failed - Valid encounter within last 6 months    Recent Outpatient Visits           10 months ago Mild intermittent asthma without complication   [facility] Delsa Grana, PA-C   1 year ago Uncontrolled type 2 diabetes mellitus with hyperglycemia, with long-term current use of insulin [facility])   [facility] Lebron Conners D, MD   1 year ago Uncontrolled type 2 diabetes mellitus with hyperglycemia, with long-term current use of insulin [facility])   [facility] Delsa Grana, PA-C   1 year ago Acute cystitis with hematuria   [facility] Delsa Grana, PA-C   1 year ago Uncontrolled type 2 diabetes mellitus with hyperglycemia, with long-term current use of insulin [facility])   [facility] [place], [place], PA-C              Passed - Completed PHQ-2 or PHQ-9 in the last 360 days       JANUVIA 100 MG tablet [Pharmacy Med Name: Januvia 100 MG Oral Tablet] 60 tablet 0    Sig: TAKE 1 TABLET BY MOUTH ONCE DAILY FOR  DIABETES     Endocrinology:  Diabetes - DPP-4 Inhibitors Failed - 05/23/2021 12:02 PM      Failed - HBA1C is between 0 and 7.9 and within 180 days    HbA1c, POC (prediabetic range)  Date Value Ref Range Status  11/05/2018 13.0 (A) 5.7 - 6.4 % Final   HbA1c,  POC (controlled diabetic range)  Date Value Ref Range Status  11/05/2018 13.0 (A) 0.0 - 7.0 % Final   HbA1c POC (<> result, manual entry)  Date Value Ref Range Status  11/05/2018 13.0 4.0 - 5.6 % Final   Hgb A1c MFr Bld  Date Value Ref Range Status  11/12/2019 8.6 (H) <5.7 % of total Hgb Final    Comment:    For someone without known diabetes, a hemoglobin A1c value of 6.5% or greater indicates that they may have  diabetes and this should be confirmed with a follow-up  test. . For someone with known diabetes, a value <7% indicates  that their diabetes is well controlled and a value  greater than or equal to 7% indicates suboptimal  control. A1c targets should be individualized based on  duration of diabetes, age, comorbid conditions, and  other considerations. . Currently, no consensus exists regarding use of hemoglobin A1c for diagnosis of diabetes for children. .           Failed - Cr in normal range and within 360 days    Creat  Date Value Ref Range Status  11/12/2019 0.78 0.50 - 0.99 mg/dL Final    Comment:    For patients >49 years of  age, the reference limit for Creatinine is approximately 13% higher for people identified as African-American. .    Creatinine, Urine  Date Value Ref Range Status  11/05/2018 121 20 - 275 mg/dL Final          Failed - Valid encounter within last 6 months    Recent Outpatient Visits           10 months ago Mild intermittent asthma without complication   [facility] Delsa Grana, PA-C   1 year ago Uncontrolled type 2 diabetes mellitus with hyperglycemia, with long-term current use of insulin [facility])   [facility] Lebron Conners D, MD   1 year ago Uncontrolled type 2 diabetes mellitus with hyperglycemia, with long-term current use of insulin [facility])   [facility] Delsa Grana, PA-C   1 year ago Acute cystitis with hematuria   [facility] Delsa Grana, PA-C   1 year ago Uncontrolled type 2 diabetes mellitus with hyperglycemia, with long-term current use of insulin [facility])   [facility] Delsa Grana, [place]

## 2021-05-23 NOTE — Telephone Encounter (Signed)
Pt needs an appt

## 2021-05-23 NOTE — Telephone Encounter (Signed)
Last seen 11.2.2021 no upcoming appt sch'd

## 2021-05-24 NOTE — Telephone Encounter (Signed)
No longer have the number on file. No other way reach pt to inform to sch appt

## 2021-06-09 DIAGNOSIS — Z419 Encounter for procedure for purposes other than remedying health state, unspecified: Secondary | ICD-10-CM | POA: Diagnosis not present

## 2021-06-18 ENCOUNTER — Other Ambulatory Visit: Payer: Self-pay | Admitting: Family Medicine

## 2021-06-18 DIAGNOSIS — G4700 Insomnia, unspecified: Secondary | ICD-10-CM

## 2021-06-21 ENCOUNTER — Other Ambulatory Visit: Payer: Self-pay | Admitting: Family Medicine

## 2021-06-21 DIAGNOSIS — J4520 Mild intermittent asthma, uncomplicated: Secondary | ICD-10-CM

## 2021-06-21 NOTE — Telephone Encounter (Signed)
Appt scheduled for 07/20/21. Number was updated.

## 2021-06-21 NOTE — Telephone Encounter (Signed)
Medication: albuterol (VENTOLIN HFA) 108 (90 Base) MCG/ACT inhaler [[id number]]   Has the patient contacted their pharmacy? YES- Advised to contact the office (Agent: If no, request that the patient contact the pharmacy for the refill.) (Agent: If yes, when and what did the pharmacy advise?)  Preferred Pharmacy (with phone number or street name):  Has the patient been seen for an appointment in the last year [place] [address] [place] - [address] [place], [place] [address] Phone: [phone] Fax: [phone] Hours: Not open 24 hours  R does the patient have an upcoming appointment? YES 07/10/21  Agent: Please be advised that RX refills may take up to 3 business days. We ask that you follow-up with your pharmacy.

## 2021-06-22 MED ORDER — ALBUTEROL SULFATE HFA 108 (90 BASE) MCG/ACT IN AERS: 2.0000 | INHALATION_SPRAY | Freq: Four times a day (QID) | RESPIRATORY_TRACT | 0 refills | Status: DC | PRN

## 2021-06-22 NOTE — Telephone Encounter (Signed)
Requested Prescriptions  Pending Prescriptions Disp Refills  . albuterol (VENTOLIN HFA) 108 (90 Base) MCG/ACT inhaler 8 g 0    Sig: Inhale 2 puffs into the lungs every 6 (six) hours as needed for wheezing or shortness of breath.     Pulmonology:  Beta Agonists Failed - 06/21/2021  4:22 PM      Failed - One inhaler should last at least one month. If the patient is requesting refills earlier, contact the patient to check for uncontrolled symptoms.      Passed - Valid encounter within last 12 months    Recent Outpatient Visits          11 months ago Mild intermittent asthma without complication   [facility] Delsa Grana, PA-C   1 year ago Uncontrolled type 2 diabetes mellitus with hyperglycemia, with long-term current use of insulin [facility], [facility]   1 year ago Uncontrolled type 2 diabetes mellitus with hyperglycemia, with long-term current use of insulin [facility])   [facility] Delsa Grana, PA-C   1 year ago Acute cystitis with hematuria   [facility] Delsa Grana, PA-C   1 year ago Uncontrolled type 2 diabetes mellitus with hyperglycemia, with long-term current use of insulin [facility])   [facility] Delsa Grana, PA-C      Future Appointments            In 2 weeks Delsa Grana, PA-C [facility], [facility]

## 2021-07-10 ENCOUNTER — Ambulatory Visit: Payer: Medicaid Other | Admitting: Family Medicine

## 2021-07-10 DIAGNOSIS — Z794 Long term (current) use of insulin: Secondary | ICD-10-CM

## 2021-07-10 DIAGNOSIS — E782 Mixed hyperlipidemia: Secondary | ICD-10-CM

## 2021-07-10 DIAGNOSIS — G4700 Insomnia, unspecified: Secondary | ICD-10-CM

## 2021-07-10 DIAGNOSIS — Z5181 Encounter for therapeutic drug level monitoring: Secondary | ICD-10-CM

## 2021-07-10 DIAGNOSIS — J4520 Mild intermittent asthma, uncomplicated: Secondary | ICD-10-CM

## 2021-07-10 DIAGNOSIS — Z419 Encounter for procedure for purposes other than remedying health state, unspecified: Secondary | ICD-10-CM | POA: Diagnosis not present

## 2021-07-11 ENCOUNTER — Ambulatory Visit: Payer: Medicaid Other | Admitting: Family Medicine

## 2021-07-11 ENCOUNTER — Other Ambulatory Visit: Payer: Self-pay

## 2021-07-11 ENCOUNTER — Ambulatory Visit: Payer: Medicaid Other | Admitting: Nurse Practitioner

## 2021-07-11 ENCOUNTER — Encounter: Payer: Self-pay | Admitting: Nurse Practitioner

## 2021-07-11 VITALS — BP 132/84 | HR 98 | Temp 98.2°F | Resp 16 | Ht 67.0 in | Wt 185.3 lb

## 2021-07-11 DIAGNOSIS — Z23 Encounter for immunization: Secondary | ICD-10-CM

## 2021-07-11 DIAGNOSIS — Z1211 Encounter for screening for malignant neoplasm of colon: Secondary | ICD-10-CM | POA: Diagnosis not present

## 2021-07-11 DIAGNOSIS — Z794 Long term (current) use of insulin: Secondary | ICD-10-CM

## 2021-07-11 DIAGNOSIS — E1165 Type 2 diabetes mellitus with hyperglycemia: Secondary | ICD-10-CM | POA: Diagnosis not present

## 2021-07-11 DIAGNOSIS — Z130 Encounter for screening for diseases of the blood and blood-forming organs and certain disorders involving the immune mechanism: Secondary | ICD-10-CM

## 2021-07-11 DIAGNOSIS — G4700 Insomnia, unspecified: Secondary | ICD-10-CM

## 2021-07-11 DIAGNOSIS — Z1231 Encounter for screening mammogram for malignant neoplasm of breast: Secondary | ICD-10-CM | POA: Diagnosis not present

## 2021-07-11 DIAGNOSIS — E782 Mixed hyperlipidemia: Secondary | ICD-10-CM

## 2021-07-11 DIAGNOSIS — Z122 Encounter for screening for malignant neoplasm of respiratory organs: Secondary | ICD-10-CM

## 2021-07-11 DIAGNOSIS — J4520 Mild intermittent asthma, uncomplicated: Secondary | ICD-10-CM | POA: Diagnosis not present

## 2021-07-11 DIAGNOSIS — E559 Vitamin D deficiency, unspecified: Secondary | ICD-10-CM | POA: Diagnosis not present

## 2021-07-11 DIAGNOSIS — Z13220 Encounter for screening for lipoid disorders: Secondary | ICD-10-CM

## 2021-07-11 DIAGNOSIS — K219 Gastro-esophageal reflux disease without esophagitis: Secondary | ICD-10-CM | POA: Diagnosis not present

## 2021-07-11 DIAGNOSIS — R634 Abnormal weight loss: Secondary | ICD-10-CM | POA: Diagnosis not present

## 2021-07-11 DIAGNOSIS — Z114 Encounter for screening for human immunodeficiency virus [HIV]: Secondary | ICD-10-CM

## 2021-07-11 DIAGNOSIS — Z1159 Encounter for screening for other viral diseases: Secondary | ICD-10-CM

## 2021-07-11 MED ORDER — SITAGLIPTIN PHOSPHATE 100 MG PO TABS: ORAL_TABLET | ORAL | 3 refills | Status: DC

## 2021-07-11 MED ORDER — TRAZODONE HCL 100 MG PO TABS: 100.0000 mg | ORAL_TABLET | Freq: Every evening | ORAL | 1 refills | Status: DC | PRN

## 2021-07-11 MED ORDER — EZETIMIBE 10 MG PO TABS: 10.0000 mg | ORAL_TABLET | Freq: Every day | ORAL | 3 refills | Status: AC

## 2021-07-11 MED ORDER — LANTUS SOLOSTAR 100 UNIT/ML ~~LOC~~ SOPN: PEN_INJECTOR | SUBCUTANEOUS | 3 refills | Status: AC

## 2021-07-11 NOTE — Progress Notes (Signed)
BP 132/84   Pulse 98   Temp 98.2 F (36.8 C) (Oral)   Resp 16   Ht 5\' 7"  (1.702 m)   Wt 185 lb 4.8 oz (84.1 kg)   LMP 04/14/2015   SpO2 99%   BMI 29.02 kg/m    Subjective:    Patient ID: Faith Smith, female    DOB: 06-08-1961, 60 y.o.   MRN: [id number]  HPI: Faith Smith is a 59 y.o. female, here alone  Chief Complaint  Patient presents with   Diabetes   Hyperlipidemia    Follow up medication refills   Weight Loss   Diabetes:  She has not had labs drawn in a while.  Her last A1C was  8.6 on 11/12/19.  She says she has been taking her Januvia 100 mg daily and her lantus 30 units daily.  She has not been checking her blood sugar.  Will get labs today.  She denies any polyphagia, polydipsia or polyuria.   Hyperlipidemia:  She says she has not been taking her Zetia.  She says she has been out of her prescription. Last LDL was 165 on 11/12/19. Will recheck today.  GERD: She says that her acid reflux has increased so she is now taking Pepcid 40 mg. She says since increasing the dose she has been doing better.   Asthma: She says her asthma has not been giving her issues lately.  She uses her albuterol about 3 times a month.  Weight loss:  She says she has not done anything to try and lose weight and she is unsure about how much.  She says her family has told her that they are concerned about her weight loss.  According to our records last time she was here on 05/09/20 she was 210 lbs she is now 185. She denies any rectal bleeding. She denies any abdominal pain, fever or shortness of breath.  Will get lab work, lung cancer screening due to being a smoker, egd and colonoscopy.  Insomnia:  She takes trazodone when she is unable to fall asleep.  Will give refill on medication.   Vitamin D deficiency:  She is not taking any supplement.  Last Vitamin D level was checked on 11/12/19 it was 23.  Will recheck level.   Relevant past medical, surgical, family and social history reviewed and  updated as indicated. Interim medical history since our last visit reviewed. Allergies and medications reviewed and updated.  Review of Systems  Constitutional: Negative for fever, positive for weight change.  Respiratory: Negative for cough and shortness of breath.   Cardiovascular: Negative for chest pain or palpitations.  Gastrointestinal: Negative for abdominal pain, no bowel changes.  Musculoskeletal: Negative for gait problem or joint swelling.  Skin: Negative for rash.  Neurological: Negative for dizziness or headache.  No other specific complaints in a complete review of systems (except as listed in HPI above).      Objective:    BP 132/84   Pulse 98   Temp 98.2 F (36.8 C) (Oral)   Resp 16   Ht 5\' 7"  (1.702 m)   Wt 185 lb 4.8 oz (84.1 kg)   LMP 04/14/2015   SpO2 99%   BMI 29.02 kg/m   Wt Readings from Last 3 Encounters:  07/11/21 185 lb 4.8 oz (84.1 kg)  07/22/19 210 lb (95.3 kg)  07/09/19 210 lb (95.3 kg)    Physical Exam  Constitutional: Patient appears well-developed and well-nourished. No distress.  HEENT:  head atraumatic, normocephalic, pupils equal and reactive to light, neck supple Cardiovascular: Normal rate, regular rhythm and normal heart sounds.  No murmur heard. No BLE edema. Pulmonary/Chest: Effort normal and breath sounds normal. No respiratory distress. Abdominal: Soft.  There is no tenderness. Psychiatric: Patient has a normal mood and affect. behavior is normal. Judgment and thought content normal.   Diabetic Foot Exam - Simple   Simple Foot Form Diabetic Foot exam was performed with the following findings: Yes 07/11/2021  9:45 AM  Visual Inspection No deformities, no ulcerations, no other skin breakdown bilaterally: Yes Sensation Testing Intact to touch and monofilament testing bilaterally: Yes Pulse Check Posterior Tibialis and Dorsalis pulse intact bilaterally: Yes Comments      Results for orders placed or performed in visit on  11/12/19  CBC with Differential/Platelet  Result Value Ref Range   WBC 10.7 3.8 - 10.8 Thousand/uL   RBC 4.58 3.80 - 5.10 Million/uL   Hemoglobin 13.0 11.7 - 15.5 g/dL   HCT 37.6 35.0 - 45.0 %   MCV 82.1 80.0 - 100.0 fL   MCH 28.4 27.0 - 33.0 pg   MCHC 34.6 32.0 - 36.0 g/dL   RDW 13.8 11.0 - 15.0 %   Platelets 363 140 - 400 Thousand/uL   MPV 9.6 7.5 - 12.5 fL   Neutro Abs 7,116 1,500 - 7,800 cells/uL   Lymphs Abs 2,878 850 - 3,900 cells/uL   Absolute Monocytes 332 200 - 950 cells/uL   Eosinophils Absolute 310 15 - 500 cells/uL   Basophils Absolute 64 0 - 200 cells/uL   Neutrophils Relative % 66.5 %   Total Lymphocyte 26.9 %   Monocytes Relative 3.1 %   Eosinophils Relative 2.9 %   Basophils Relative 0.6 %   Smear Review    COMPLETE METABOLIC PANEL WITH GFR  Result Value Ref Range   Glucose, Bld 204 (H) 65 - 99 mg/dL   BUN 13 7 - 25 mg/dL   Creat 0.78 0.50 - 0.99 mg/dL   GFR, Est Non African American 83 > OR = 60 mL/min/1.64m2   GFR, Est African American 96 > OR = 60 mL/min/1.59m2   BUN/Creatinine Ratio NOT APPLICABLE 6 - 22 (calc)   Sodium 140 135 - 146 mmol/L   Potassium 3.8 3.5 - 5.3 mmol/L   Chloride 104 98 - 110 mmol/L   CO2 26 20 - 32 mmol/L   Calcium 10.1 8.6 - 10.4 mg/dL   Total Protein 6.8 6.1 - 8.1 g/dL   Albumin 4.0 3.6 - 5.1 g/dL   Globulin 2.8 1.9 - 3.7 g/dL (calc)   AG Ratio 1.4 1.0 - 2.5 (calc)   Total Bilirubin 0.3 0.2 - 1.2 mg/dL   Alkaline phosphatase (APISO) 90 37 - 153 U/L   AST 15 10 - 35 U/L   ALT 20 6 - 29 U/L  Lipid panel  Result Value Ref Range   Cholesterol 240 (H) <200 mg/dL   HDL 37 (L) > OR = 50 mg/dL   Triglycerides 212 (H) <150 mg/dL   LDL Cholesterol (Calc) 165 (H) mg/dL (calc)   Total CHOL/HDL Ratio 6.5 (H) <5.0 (calc)   Non-HDL Cholesterol (Calc) 203 (H) <130 mg/dL (calc)  Hemoglobin A1c  Result Value Ref Range   Hgb A1c MFr Bld 8.6 (H) <5.7 % of total Hgb   Mean Plasma Glucose 200 (calc)   eAG (mmol/L) 11.1 (calc)  VITAMIN D  25 Hydroxy (Vit-D Deficiency, Fractures)  Result Value Ref Range   Vit  D, 25-Hydroxy 23 (L) 30 - 100 ng/mL      Assessment & Plan:   1. Uncontrolled type 2 diabetes mellitus with hyperglycemia, with long-term current use of insulin (HCC)  - HM Diabetes Foot Exam - Hemoglobin A1c - Microalbumin / creatinine urine ratio - sitaGLIPtin (JANUVIA) 100 MG tablet; TAKE 1 TABLET BY MOUTH ONCE DAILY FOR  DIABETES  Dispense: 90 tablet; Refill: 3 - insulin glargine (LANTUS SOLOSTAR) 100 UNIT/ML Solostar Pen; INJECT 30 UNITS SUBCUTANEOUSLY ONCE DAILY  Dispense: 15 mL; Refill: 3   2. Hypercholesterolemia with hypertriglyceridemia  - ezetimibe (ZETIA) 10 MG tablet; Take 1 tablet (10 mg total) by mouth daily.  Dispense: 90 tablet; Refill: 3  3. Mild intermittent asthma without complication   4. Gastroesophageal reflux disease without esophagitis   5. Weight loss  - Ambulatory referral to Gastroenterology - CBC with Differential/Platelet - COMPLETE METABOLIC PANEL WITH GFR - TSH - HIV Antibody (routine testing w rflx) - Hepatitis C antibody  6. Insomnia, unspecified type  - traZODone (DESYREL) 100 MG tablet; Take 1 tablet (100 mg total) by mouth at bedtime as needed.  Dispense: 90 tablet; Refill: 1  7. Vitamin D deficiency  - VITAMIN D 25 Hydroxy (Vit-D Deficiency, Fractures)  8. Need for pneumococcal vaccine  - Pneumococcal conjugate vaccine 20-valent (Prevnar 20)  9. Need for influenza vaccination  - Flu vaccine QUAD 6+ mos PF IM (Fluarix Quad PF)  10. Screening for colon cancer  - Ambulatory Referral Lung Cancer Screening [facility]  11. Screening mammogram, encounter for  - MM Digital Diagnostic Bilat; Future  12. Screening for lung cancer  - Ambulatory Referral Lung Cancer Screening [facility]  13. Screening for deficiency anemia  - CBC with Differential/Platelet  14. Screening for HIV (human immunodeficiency virus)  - HIV Antibody (routine  testing w rflx)  15. Need for hepatitis C screening test  - Hepatitis C antibody  16. Screening for cholesterol level  - Lipid panel   Follow up plan: Return in about 1 week (around 07/18/2021) for follow up.

## 2021-07-12 LAB — CBC WITH DIFFERENTIAL/PLATELET
Absolute Monocytes: 387 cells/uL (ref 200–950)
Basophils Absolute: 79 cells/uL (ref 0–200)
Basophils Relative: 0.9 %
Eosinophils Absolute: 141 cells/uL (ref 15–500)
Eosinophils Relative: 1.6 %
HCT: 40.8 % (ref 35.0–45.0)
Hemoglobin: 13.5 g/dL (ref 11.7–15.5)
Lymphs Abs: 2684 cells/uL (ref 850–3900)
MCH: 27.2 pg (ref 27.0–33.0)
MCHC: 33.1 g/dL (ref 32.0–36.0)
MCV: 82.1 fL (ref 80.0–100.0)
MPV: 10.3 fL (ref 7.5–12.5)
Monocytes Relative: 4.4 %
Neutro Abs: 5509 cells/uL (ref 1500–7800)
Neutrophils Relative %: 62.6 %
Platelets: 344 10*3/uL (ref 140–400)
RBC: 4.97 10*6/uL (ref 3.80–5.10)
RDW: 13.1 % (ref 11.0–15.0)
Total Lymphocyte: 30.5 %
WBC: 8.8 10*3/uL (ref 3.8–10.8)

## 2021-07-12 LAB — COMPLETE METABOLIC PANEL WITH GFR
AG Ratio: 1.4 (calc) (ref 1.0–2.5)
ALT: 13 U/L (ref 6–29)
AST: 9 U/L — ABNORMAL LOW (ref 10–35)
Albumin: 4.2 g/dL (ref 3.6–5.1)
Alkaline phosphatase (APISO): 128 U/L (ref 37–153)
BUN: 10 mg/dL (ref 7–25)
CO2: 27 mmol/L (ref 20–32)
Calcium: 9.6 mg/dL (ref 8.6–10.4)
Chloride: 101 mmol/L (ref 98–110)
Creat: 0.84 mg/dL (ref 0.50–1.05)
Globulin: 2.9 g/dL (calc) (ref 1.9–3.7)
Glucose, Bld: 399 mg/dL — ABNORMAL HIGH (ref 65–99)
Potassium: 3.7 mmol/L (ref 3.5–5.3)
Sodium: 137 mmol/L (ref 135–146)
Total Bilirubin: 0.3 mg/dL (ref 0.2–1.2)
Total Protein: 7.1 g/dL (ref 6.1–8.1)
eGFR: 79 mL/min/{1.73_m2} (ref >=60)

## 2021-07-12 LAB — LIPID PANEL
Cholesterol: 262 mg/dL — ABNORMAL HIGH (ref <200)
HDL: 40 mg/dL — ABNORMAL LOW (ref >=50)
LDL Cholesterol (Calc): 164 mg/dL (calc) — ABNORMAL HIGH
Non-HDL Cholesterol (Calc): 222 mg/dL (calc) — ABNORMAL HIGH (ref <130)
Total CHOL/HDL Ratio: 6.6 (calc) — ABNORMAL HIGH (ref <5.0)
Triglycerides: 341 mg/dL — ABNORMAL HIGH (ref <150)

## 2021-07-12 LAB — TSH: TSH: 1.84 mIU/L (ref 0.40–4.50)

## 2021-07-12 LAB — HIV ANTIBODY (ROUTINE TESTING W REFLEX): HIV 1&2 Ab, 4th Generation: NONREACTIVE

## 2021-07-12 LAB — HEMOGLOBIN A1C
Hgb A1c MFr Bld: 12 % of total Hgb — ABNORMAL HIGH (ref <5.7)
Mean Plasma Glucose: 298 mg/dL
eAG (mmol/L): 16.5 mmol/L

## 2021-07-12 LAB — HEPATITIS C ANTIBODY
Hepatitis C Ab: NONREACTIVE
SIGNAL TO CUT-OFF: 0.05 (ref <1.00)

## 2021-07-12 LAB — VITAMIN D 25 HYDROXY (VIT D DEFICIENCY, FRACTURES): Vit D, 25-Hydroxy: 20 ng/mL — ABNORMAL LOW (ref 30–100)

## 2021-07-12 LAB — MICROALBUMIN / CREATININE URINE RATIO
Creatinine, Urine: 66 mg/dL (ref 20–275)
Microalb Creat Ratio: 18 mcg/mg creat (ref <30)
Microalb, Ur: 1.2 mg/dL

## 2021-07-19 ENCOUNTER — Encounter: Payer: Self-pay | Admitting: Nurse Practitioner

## 2021-07-19 ENCOUNTER — Ambulatory Visit (INDEPENDENT_AMBULATORY_CARE_PROVIDER_SITE_OTHER): Payer: Medicaid Other | Admitting: Nurse Practitioner

## 2021-07-19 ENCOUNTER — Other Ambulatory Visit: Payer: Self-pay

## 2021-07-19 VITALS — BP 124/72 | HR 98 | Temp 98.4°F | Resp 16 | Ht 67.0 in | Wt 187.8 lb

## 2021-07-19 DIAGNOSIS — E782 Mixed hyperlipidemia: Secondary | ICD-10-CM

## 2021-07-19 DIAGNOSIS — M62830 Muscle spasm of back: Secondary | ICD-10-CM | POA: Diagnosis not present

## 2021-07-19 DIAGNOSIS — Z794 Long term (current) use of insulin: Secondary | ICD-10-CM | POA: Diagnosis not present

## 2021-07-19 DIAGNOSIS — E1165 Type 2 diabetes mellitus with hyperglycemia: Secondary | ICD-10-CM

## 2021-07-19 DIAGNOSIS — B3731 Acute candidiasis of vulva and vagina: Secondary | ICD-10-CM

## 2021-07-19 DIAGNOSIS — E559 Vitamin D deficiency, unspecified: Secondary | ICD-10-CM | POA: Diagnosis not present

## 2021-07-19 MED ORDER — SITAGLIPTIN PHOSPHATE 100 MG PO TABS: ORAL_TABLET | ORAL | 3 refills | Status: AC

## 2021-07-19 MED ORDER — FLUCONAZOLE 150 MG PO TABS: 150.0000 mg | ORAL_TABLET | Freq: Once | ORAL | 0 refills | Status: AC

## 2021-07-19 MED ORDER — METHOCARBAMOL 500 MG PO TABS: 500.0000 mg | ORAL_TABLET | Freq: Two times a day (BID) | ORAL | 0 refills | Status: AC | PRN

## 2021-07-19 MED ORDER — GVOKE HYPOPEN 1-PACK 1 MG/0.2ML ~~LOC~~ SOAJ: 1.0000 mg | SUBCUTANEOUS | 1 refills | Status: AC | PRN

## 2021-07-19 NOTE — Progress Notes (Signed)
BP 124/72   Pulse 98   Temp 98.4 F (36.9 C) (Oral)   Resp 16   Ht $R'5\' 7"'nO$  (1.702 m)   Wt 187 lb 12.8 oz (85.2 kg)   LMP 04/14/2015   SpO2 98%   BMI 29.41 kg/m    Subjective:    Patient ID: Faith Smith, female    DOB: 1961-07-06, 60 y.o.   MRN: [id number]  HPI: Faith Smith is a 60 y.o. female, here alone  Chief Complaint  Patient presents with   Follow-up    1 week recheck   DM:  She is currently taking Lantus 30 units at night.  She is also taking januvia 100 mg daily. She did not tolerate metformin.  She checks her blood sugar three times a week but not always when she is fasting.  She says they have been all over from 180-300.  Discussed that she needs to check her blood sugar every morning and keep track of her fasting glucose levels.  She is due for her eye exam.  She is going to an appointment. A1C is 12.  Discussed titrating insulin.  She is agreeable to plan.  She will titrate 2 units every couple of days for a goal of fasting blood sugar between 100-140.  Discussed signs of hypoglycemia and prescribed HypoPen.  Also put referral for community care coordination.    Yeast infection:  She reports she has another yeast infection.  She says she knows it is because of her diabetes.  She has had symptoms for a few days.  White thick vaginal discharge.  Back pain:  She describes the pain on the right lower back.  Denies any urinary complaints.  She denies any numbness or tingling or incontinence.  She denies any radiation of pain.  She denies any known trauma.  She reports the pain as achy and pulling when she moves.  Discussed a muscle relaxer, taking tylenol and heat therapy.    Vitamin D deficiency:  She has not been taking her vitamin D.  Discussed that her Vitamin D level is still low and she needs to resume her vitamin d supplementation.  She verbalized understanding.   Hypercholesterolemia with hypertriglyceridemia:  She is currently taking zetia 10 mg.  She is unable  to tolerate statins.  Discussed options.  She is going to work on getting her diabetes under control and improve her diet.  Current LDL is 164.   The 10-year ASCVD risk score (Arnett DK, et al., 2019) is: 32.2%   Values used to calculate the score:     Age: 73 years     Sex: Female     Is Non-Hispanic African American: Yes     Diabetic: Yes     Tobacco smoker: Yes     Systolic Blood Pressure: 643 mmHg     Is BP treated: No     HDL Cholesterol: 40 mg/dL     Total Cholesterol: 262 mg/dL   Relevant past medical, surgical, family and social history reviewed and updated as indicated. Interim medical history since our last visit reviewed. Allergies and medications reviewed and updated.  Review of Systems  Constitutional: Negative for fever or weight change.  Respiratory: Negative for cough and shortness of breath.   Cardiovascular: Negative for chest pain or palpitations.  Gastrointestinal: Negative for abdominal pain, no bowel changes.  Musculoskeletal: Negative for gait problem or joint swelling. Positive for right lower back pain. Skin: Negative for rash.  Neurological: Negative  for dizziness or headache.  No other specific complaints in a complete review of systems (except as listed in HPI above).      Objective:    BP 124/72   Pulse 98   Temp 98.4 F (36.9 C) (Oral)   Resp 16   Ht $R'5\' 7"'ia$  (1.702 m)   Wt 187 lb 12.8 oz (85.2 kg)   LMP 04/14/2015   SpO2 98%   BMI 29.41 kg/m   Wt Readings from Last 3 Encounters:  07/19/21 187 lb 12.8 oz (85.2 kg)  07/11/21 185 lb 4.8 oz (84.1 kg)  07/22/19 210 lb (95.3 kg)    Physical Exam  Constitutional: Patient appears well-developed and well-nourished. No distress.  HEENT: head atraumatic, normocephalic, pupils equal and reactive to light, neck supple Cardiovascular: Normal rate, regular rhythm and normal heart sounds.  No murmur heard. No BLE edema. Pulmonary/Chest: Effort normal and breath sounds normal. No respiratory  distress. Abdominal: Soft.  There is no tenderness. Musculoskeletal: Tenderness noted to right lower back, decreased ROM noted Psychiatric: Patient has a normal mood and affect. behavior is normal. Judgment and thought content normal.   Results for orders placed or performed in visit on 07/11/21  Hemoglobin A1c  Result Value Ref Range   Hgb A1c MFr Bld 12.0 (H) <5.7 % of total Hgb   Mean Plasma Glucose 298 mg/dL   eAG (mmol/L) 16.5 mmol/L  CBC with Differential/Platelet  Result Value Ref Range   WBC 8.8 3.8 - 10.8 Thousand/uL   RBC 4.97 3.80 - 5.10 Million/uL   Hemoglobin 13.5 11.7 - 15.5 g/dL   HCT 40.8 35.0 - 45.0 %   MCV 82.1 80.0 - 100.0 fL   MCH 27.2 27.0 - 33.0 pg   MCHC 33.1 32.0 - 36.0 g/dL   RDW 13.1 11.0 - 15.0 %   Platelets 344 140 - 400 Thousand/uL   MPV 10.3 7.5 - 12.5 fL   Neutro Abs 5,509 1,500 - 7,800 cells/uL   Lymphs Abs 2,684 850 - 3,900 cells/uL   Absolute Monocytes 387 200 - 950 cells/uL   Eosinophils Absolute 141 15 - 500 cells/uL   Basophils Absolute 79 0 - 200 cells/uL   Neutrophils Relative % 62.6 %   Total Lymphocyte 30.5 %   Monocytes Relative 4.4 %   Eosinophils Relative 1.6 %   Basophils Relative 0.9 %  COMPLETE METABOLIC PANEL WITH GFR  Result Value Ref Range   Glucose, Bld 399 (H) 65 - 99 mg/dL   BUN 10 7 - 25 mg/dL   Creat 0.84 0.50 - 1.05 mg/dL   eGFR 79 > OR = 60 mL/min/1.36m2   BUN/Creatinine Ratio NOT APPLICABLE 6 - 22 (calc)   Sodium 137 135 - 146 mmol/L   Potassium 3.7 3.5 - 5.3 mmol/L   Chloride 101 98 - 110 mmol/L   CO2 27 20 - 32 mmol/L   Calcium 9.6 8.6 - 10.4 mg/dL   Total Protein 7.1 6.1 - 8.1 g/dL   Albumin 4.2 3.6 - 5.1 g/dL   Globulin 2.9 1.9 - 3.7 g/dL (calc)   AG Ratio 1.4 1.0 - 2.5 (calc)   Total Bilirubin 0.3 0.2 - 1.2 mg/dL   Alkaline phosphatase (APISO) 128 37 - 153 U/L   AST 9 (L) 10 - 35 U/L   ALT 13 6 - 29 U/L  TSH  Result Value Ref Range   TSH 1.84 0.40 - 4.50 mIU/L  HIV Antibody (routine testing w rflx)   Result Value Ref Range  HIV 1&2 Ab, 4th Generation NON-REACTIVE NON-REACTIVE  Hepatitis C antibody  Result Value Ref Range   Hepatitis C Ab NON-REACTIVE NON-REACTIVE   SIGNAL TO CUT-OFF 0.05 <1.00  Lipid panel  Result Value Ref Range   Cholesterol 262 (H) <200 mg/dL   HDL 40 (L) > OR = 50 mg/dL   Triglycerides 341 (H) <150 mg/dL   LDL Cholesterol (Calc) 164 (H) mg/dL (calc)   Total CHOL/HDL Ratio 6.6 (H) <5.0 (calc)   Non-HDL Cholesterol (Calc) 222 (H) <130 mg/dL (calc)  VITAMIN D 25 Hydroxy (Vit-D Deficiency, Fractures)  Result Value Ref Range   Vit D, 25-Hydroxy 20 (L) 30 - 100 ng/mL  Microalbumin / creatinine urine ratio  Result Value Ref Range   Creatinine, Urine 66 20 - 275 mg/dL   Microalb, Ur 1.2 mg/dL   Microalb Creat Ratio 18 <30 mcg/mg creat      Assessment & Plan:   1. Uncontrolled type 2 diabetes mellitus with hyperglycemia, with long-term current use of insulin (HCC)  - sitaGLIPtin (JANUVIA) 100 MG tablet; TAKE 1 TABLET BY MOUTH ONCE DAILY FOR  DIABETES  Dispense: 90 tablet; Refill: 3 - Glucagon (GVOKE HYPOPEN 1-PACK) 1 MG/0.2ML SOAJ; Inject 1 mg into the skin as needed.  Dispense: 0.2 mL; Refill: 1 - AMB Referral to [place]  2. Yeast infection of the vagina  - fluconazole (DIFLUCAN) 150 MG tablet; Take 1 tablet (150 mg total) by mouth once for 1 dose.  Dispense: 1 tablet; Refill: 0  3. Muscle spasm of back  - methocarbamol (ROBAXIN) 500 MG tablet; Take 1 tablet (500 mg total) by mouth 2 (two) times daily as needed for muscle spasms.  Dispense: 20 tablet; Refill: 0  4. Vitamin D deficiency -restart Vitamin d supplements  5. Hypercholesterolemia with hypertriglyceridemia -continue taking zetia -improve diet -get diabetes under control  Follow up plan: Return in about 4 weeks (around 08/16/2021) for follow up.

## 2021-08-09 DIAGNOSIS — Z419 Encounter for procedure for purposes other than remedying health state, unspecified: Secondary | ICD-10-CM | POA: Diagnosis not present

## 2021-08-14 ENCOUNTER — Other Ambulatory Visit: Payer: Self-pay

## 2021-08-14 ENCOUNTER — Other Ambulatory Visit: Payer: Self-pay | Admitting: Emergency Medicine

## 2021-08-14 DIAGNOSIS — Z794 Long term (current) use of insulin: Secondary | ICD-10-CM

## 2021-08-14 DIAGNOSIS — E1165 Type 2 diabetes mellitus with hyperglycemia: Secondary | ICD-10-CM

## 2021-08-14 MED ORDER — ACCU-CHEK GUIDE VI STRP: ORAL_STRIP | 12 refills | Status: AC

## 2021-08-16 ENCOUNTER — Ambulatory Visit: Payer: Medicaid Other | Admitting: Nurse Practitioner

## 2021-08-22 ENCOUNTER — Telehealth: Payer: Self-pay | Admitting: Family Medicine

## 2021-08-22 DIAGNOSIS — K219 Gastro-esophageal reflux disease without esophagitis: Secondary | ICD-10-CM

## 2021-08-23 NOTE — Telephone Encounter (Signed)
Patient called in about getting her heartburn medication, refilled, says not appropriate, please call back with more info on this

## 2021-08-24 ENCOUNTER — Other Ambulatory Visit: Payer: Self-pay

## 2021-08-24 DIAGNOSIS — K219 Gastro-esophageal reflux disease without esophagitis: Secondary | ICD-10-CM

## 2021-08-24 MED ORDER — FAMOTIDINE 20 MG PO TABS: ORAL_TABLET | ORAL | 0 refills | Status: DC

## 2021-08-24 NOTE — Telephone Encounter (Signed)
Medication refill has been sent.

## 2021-09-09 DIAGNOSIS — Z419 Encounter for procedure for purposes other than remedying health state, unspecified: Secondary | ICD-10-CM | POA: Diagnosis not present

## 2021-09-17 ENCOUNTER — Encounter: Payer: Self-pay | Admitting: Nurse Practitioner

## 2021-09-17 ENCOUNTER — Ambulatory Visit: Payer: Medicaid Other | Admitting: Nurse Practitioner

## 2021-09-17 ENCOUNTER — Other Ambulatory Visit: Payer: Self-pay

## 2021-09-17 VITALS — BP 132/76 | HR 91 | Temp 98.2°F | Resp 16 | Ht 67.0 in | Wt 189.4 lb

## 2021-09-17 DIAGNOSIS — Z794 Long term (current) use of insulin: Secondary | ICD-10-CM | POA: Diagnosis not present

## 2021-09-17 DIAGNOSIS — J0141 Acute recurrent pansinusitis: Secondary | ICD-10-CM | POA: Diagnosis not present

## 2021-09-17 DIAGNOSIS — M25511 Pain in right shoulder: Secondary | ICD-10-CM | POA: Diagnosis not present

## 2021-09-17 DIAGNOSIS — E1165 Type 2 diabetes mellitus with hyperglycemia: Secondary | ICD-10-CM

## 2021-09-17 DIAGNOSIS — G8929 Other chronic pain: Secondary | ICD-10-CM | POA: Diagnosis not present

## 2021-09-17 MED ORDER — FLUTICASONE PROPIONATE 50 MCG/ACT NA SUSP
2.0000 | Freq: Every day | NASAL | 6 refills | Status: AC
Start: 2021-09-17 — End: ?

## 2021-09-17 MED ORDER — CETIRIZINE HCL 10 MG PO TABS: 10.0000 mg | ORAL_TABLET | Freq: Every day | ORAL | 11 refills | Status: AC

## 2021-09-17 NOTE — Progress Notes (Signed)
BP 132/76    Pulse 91    Temp 98.2 F (36.8 C) (Oral)    Resp 16    Ht $R'5\' 7"'TV$  (1.702 m)    Wt 189 lb 6.4 oz (85.9 kg)    LMP 04/14/2015    SpO2 99%    BMI 29.66 kg/m    Subjective:    Patient ID: Faith Smith, female    DOB: April 24, 1961, 61 y.o.   MRN: [id number]  HPI: Faith Smith is a 61 y.o. female  Chief Complaint  Patient presents with   Diabetes    4 week recheck   Diabetes:  her last A1C was 12 on 07/11/21.  She was taking Lantus 30 units at night.  She was also taking januvia 100 mg daily.  She does not tolerated Metformin.  She is currently taking Lantus 34 units at night. Her blood sugars have been running 120-150.  She denies any hypoglycemia episodes, polyuria, polydipsia, and polyphagia.    Chronic Right shoulder pain:  She says for many years now she has had right shoulder pain. She denies any injury.  She says she would like to see an orthopedic doctor.  Will send in referral.  She does have decrease ROM.   Sinusitis:  She says that she has had issues with her sinuses for years.  She says it comes and goes multiple times a year.  She says she has nasal congestion and it has last a while this time. Discussed treatment.  Will start zyrtec and flonase.   Relevant past medical, surgical, family and social history reviewed and updated as indicated. Interim medical history since our last visit reviewed. Allergies and medications reviewed and updated.  Review of Systems  Constitutional: Negative for fever or weight change.  HEENT: positive for nasal congestion Respiratory: Negative for cough and shortness of breath.   Cardiovascular: Negative for chest pain or palpitations.  Gastrointestinal: Negative for abdominal pain, no bowel changes.  Musculoskeletal: Negative for gait problem or joint swelling. Positive for right shoulder pain Skin: Negative for rash.  Neurological: Negative for dizziness or headache.  No other specific complaints in a complete review of systems  (except as listed in HPI above).      Objective:    BP 132/76    Pulse 91    Temp 98.2 F (36.8 C) (Oral)    Resp 16    Ht $R'5\' 7"'RZ$  (1.702 m)    Wt 189 lb 6.4 oz (85.9 kg)    LMP 04/14/2015    SpO2 99%    BMI 29.66 kg/m   Wt Readings from Last 3 Encounters:  09/17/21 189 lb 6.4 oz (85.9 kg)  07/19/21 187 lb 12.8 oz (85.2 kg)  07/11/21 185 lb 4.8 oz (84.1 kg)    Physical Exam  Constitutional: Patient appears well-developed and well-nourished. No distress.  HEENT: head atraumatic, normocephalic, pupils equal and reactive to light,  neck supple Cardiovascular: Normal rate, regular rhythm and normal heart sounds.  No murmur heard. No BLE edema. Pulmonary/Chest: Effort normal and breath sounds normal. No respiratory distress. Abdominal: Soft.  There is no tenderness. Musculoskeletal: Right shoulder decrease ROM Psychiatric: Patient has a normal mood and affect. behavior is normal. Judgment and thought content normal.  Results for orders placed or performed in visit on 07/11/21  Hemoglobin A1c  Result Value Ref Range   Hgb A1c MFr Bld 12.0 (H) <5.7 % of total Hgb   Mean Plasma Glucose 298 mg/dL   eAG (  mmol/L) 16.5 mmol/L  CBC with Differential/Platelet  Result Value Ref Range   WBC 8.8 3.8 - 10.8 Thousand/uL   RBC 4.97 3.80 - 5.10 Million/uL   Hemoglobin 13.5 11.7 - 15.5 g/dL   HCT 40.8 35.0 - 45.0 %   MCV 82.1 80.0 - 100.0 fL   MCH 27.2 27.0 - 33.0 pg   MCHC 33.1 32.0 - 36.0 g/dL   RDW 13.1 11.0 - 15.0 %   Platelets 344 140 - 400 Thousand/uL   MPV 10.3 7.5 - 12.5 fL   Neutro Abs 5,509 1,500 - 7,800 cells/uL   Lymphs Abs 2,684 850 - 3,900 cells/uL   Absolute Monocytes 387 200 - 950 cells/uL   Eosinophils Absolute 141 15 - 500 cells/uL   Basophils Absolute 79 0 - 200 cells/uL   Neutrophils Relative % 62.6 %   Total Lymphocyte 30.5 %   Monocytes Relative 4.4 %   Eosinophils Relative 1.6 %   Basophils Relative 0.9 %  COMPLETE METABOLIC PANEL WITH GFR  Result Value Ref Range    Glucose, Bld 399 (H) 65 - 99 mg/dL   BUN 10 7 - 25 mg/dL   Creat 0.84 0.50 - 1.05 mg/dL   eGFR 79 > OR = 60 mL/min/1.56m2   BUN/Creatinine Ratio NOT APPLICABLE 6 - 22 (calc)   Sodium 137 135 - 146 mmol/L   Potassium 3.7 3.5 - 5.3 mmol/L   Chloride 101 98 - 110 mmol/L   CO2 27 20 - 32 mmol/L   Calcium 9.6 8.6 - 10.4 mg/dL   Total Protein 7.1 6.1 - 8.1 g/dL   Albumin 4.2 3.6 - 5.1 g/dL   Globulin 2.9 1.9 - 3.7 g/dL (calc)   AG Ratio 1.4 1.0 - 2.5 (calc)   Total Bilirubin 0.3 0.2 - 1.2 mg/dL   Alkaline phosphatase (APISO) 128 37 - 153 U/L   AST 9 (L) 10 - 35 U/L   ALT 13 6 - 29 U/L  TSH  Result Value Ref Range   TSH 1.84 0.40 - 4.50 mIU/L  HIV Antibody (routine testing w rflx)  Result Value Ref Range   HIV 1&2 Ab, 4th Generation NON-REACTIVE NON-REACTIVE  Hepatitis C antibody  Result Value Ref Range   Hepatitis C Ab NON-REACTIVE NON-REACTIVE   SIGNAL TO CUT-OFF 0.05 <1.00  Lipid panel  Result Value Ref Range   Cholesterol 262 (H) <200 mg/dL   HDL 40 (L) > OR = 50 mg/dL   Triglycerides 341 (H) <150 mg/dL   LDL Cholesterol (Calc) 164 (H) mg/dL (calc)   Total CHOL/HDL Ratio 6.6 (H) <5.0 (calc)   Non-HDL Cholesterol (Calc) 222 (H) <130 mg/dL (calc)  VITAMIN D 25 Hydroxy (Vit-D Deficiency, Fractures)  Result Value Ref Range   Vit D, 25-Hydroxy 20 (L) 30 - 100 ng/mL  Microalbumin / creatinine urine ratio  Result Value Ref Range   Creatinine, Urine 66 20 - 275 mg/dL   Microalb, Ur 1.2 mg/dL   Microalb Creat Ratio 18 <30 mcg/mg creat      Assessment & Plan:   1. Uncontrolled type 2 diabetes mellitus with hyperglycemia, with long-term current use of insulin ([place]) -continue current treatment -return in 2 months for follow-up  2. Chronic right shoulder pain  - Ambulatory referral to Orthopedic Surgery  3. Acute recurrent pansinusitis  - cetirizine (ZYRTEC) 10 MG tablet; Take 1 tablet (10 mg total) by mouth daily.  Dispense: 30 tablet; Refill: 11 - fluticasone (FLONASE) 50  MCG/ACT nasal spray; Place 2 sprays into  both nostrils daily.  Dispense: 16 g; Refill: 6     Follow up plan: Return in about 2 months (around 11/15/2021).

## 2021-10-10 DIAGNOSIS — Z419 Encounter for procedure for purposes other than remedying health state, unspecified: Secondary | ICD-10-CM | POA: Diagnosis not present

## 2021-10-19 DIAGNOSIS — M75101 Unspecified rotator cuff tear or rupture of right shoulder, not specified as traumatic: Secondary | ICD-10-CM | POA: Diagnosis not present

## 2021-10-19 DIAGNOSIS — M7541 Impingement syndrome of right shoulder: Secondary | ICD-10-CM | POA: Diagnosis not present

## 2021-10-19 DIAGNOSIS — M75102 Unspecified rotator cuff tear or rupture of left shoulder, not specified as traumatic: Secondary | ICD-10-CM | POA: Diagnosis not present

## 2021-11-07 DIAGNOSIS — Z419 Encounter for procedure for purposes other than remedying health state, unspecified: Secondary | ICD-10-CM | POA: Diagnosis not present

## 2021-11-15 ENCOUNTER — Ambulatory Visit: Payer: Medicaid Other | Admitting: Nurse Practitioner

## 2021-11-27 DIAGNOSIS — M25551 Pain in right hip: Secondary | ICD-10-CM | POA: Diagnosis not present

## 2021-11-27 DIAGNOSIS — S7001XA Contusion of right hip, initial encounter: Secondary | ICD-10-CM | POA: Diagnosis not present

## 2021-11-29 ENCOUNTER — Ambulatory Visit: Payer: Medicaid Other | Admitting: Family Medicine

## 2021-11-29 DIAGNOSIS — R202 Paresthesia of skin: Secondary | ICD-10-CM | POA: Diagnosis not present

## 2021-11-29 DIAGNOSIS — M25551 Pain in right hip: Secondary | ICD-10-CM | POA: Diagnosis not present

## 2021-11-29 DIAGNOSIS — M5431 Sciatica, right side: Secondary | ICD-10-CM | POA: Diagnosis not present

## 2021-11-29 DIAGNOSIS — F17210 Nicotine dependence, cigarettes, uncomplicated: Secondary | ICD-10-CM | POA: Diagnosis not present

## 2021-11-29 DIAGNOSIS — M1611 Unilateral primary osteoarthritis, right hip: Secondary | ICD-10-CM | POA: Diagnosis not present

## 2021-11-29 DIAGNOSIS — R200 Anesthesia of skin: Secondary | ICD-10-CM | POA: Diagnosis not present

## 2021-11-29 DIAGNOSIS — R4182 Altered mental status, unspecified: Secondary | ICD-10-CM | POA: Diagnosis not present

## 2021-11-29 DIAGNOSIS — W01190A Fall on same level from slipping, tripping and stumbling with subsequent striking against furniture, initial encounter: Secondary | ICD-10-CM | POA: Diagnosis not present

## 2021-11-29 DIAGNOSIS — Z7984 Long term (current) use of oral hypoglycemic drugs: Secondary | ICD-10-CM | POA: Diagnosis not present

## 2021-11-29 DIAGNOSIS — E1165 Type 2 diabetes mellitus with hyperglycemia: Secondary | ICD-10-CM | POA: Diagnosis not present

## 2021-11-30 ENCOUNTER — Telehealth: Payer: Self-pay

## 2021-11-30 NOTE — Telephone Encounter (Signed)
Transition Care Management Unsuccessful Follow-up Telephone Call ? ?Date of discharge and from where:  11/29/2021-[facility] ? ?Attempts:  1st Attempt ? ?Reason for unsuccessful TCM follow-up call:  Left voice message ? ?  ?

## 2021-12-03 NOTE — Telephone Encounter (Signed)
Transition Care Management Unsuccessful Follow-up Telephone Call ? ?Date of discharge and from where:  11/29/2021-[facility]  ? ?Attempts:  2nd Attempt ? ?Reason for unsuccessful TCM follow-up call:  Left voice message ? ?  ?

## 2021-12-04 ENCOUNTER — Ambulatory Visit: Payer: Medicaid Other | Admitting: Family Medicine

## 2021-12-05 NOTE — Telephone Encounter (Signed)
Transition Care Management Unsuccessful Follow-up Telephone Call ? ?Date of discharge and from where:  11/29/2021-[facility]  ? ?Attempts:  3rd Attempt ? ?Reason for unsuccessful TCM follow-up call:  Unable to reach patient ? ? ? ?

## 2021-12-08 DIAGNOSIS — Z419 Encounter for procedure for purposes other than remedying health state, unspecified: Secondary | ICD-10-CM | POA: Diagnosis not present

## 2021-12-14 ENCOUNTER — Other Ambulatory Visit: Payer: Self-pay | Admitting: Family Medicine

## 2021-12-14 DIAGNOSIS — J4520 Mild intermittent asthma, uncomplicated: Secondary | ICD-10-CM

## 2021-12-17 ENCOUNTER — Other Ambulatory Visit: Payer: Self-pay

## 2021-12-17 DIAGNOSIS — J4520 Mild intermittent asthma, uncomplicated: Secondary | ICD-10-CM

## 2021-12-21 ENCOUNTER — Other Ambulatory Visit: Payer: Self-pay | Admitting: Family Medicine

## 2021-12-21 DIAGNOSIS — K219 Gastro-esophageal reflux disease without esophagitis: Secondary | ICD-10-CM

## 2022-01-07 DIAGNOSIS — Z419 Encounter for procedure for purposes other than remedying health state, unspecified: Secondary | ICD-10-CM | POA: Diagnosis not present

## 2022-01-08 ENCOUNTER — Encounter: Payer: Medicaid Other | Admitting: Family Medicine

## 2022-01-10 ENCOUNTER — Other Ambulatory Visit: Payer: Self-pay | Admitting: Family Medicine

## 2022-01-10 DIAGNOSIS — G4700 Insomnia, unspecified: Secondary | ICD-10-CM

## 2022-01-10 NOTE — Telephone Encounter (Signed)
Requested Prescriptions  ?Pending Prescriptions Disp Refills  ?? traZODone (DESYREL) 100 MG tablet [Pharmacy Med Name: traZODone HCl 100 MG Oral Tablet] 30 tablet 0  ?  Sig: TAKE 1 TABLET BY MOUTH AT BEDTIME AS NEEDED  ?  ? Psychiatry: Antidepressants - Serotonin Modulator Passed - 01/10/2022  4:28 PM  ?  ?  Passed - Completed PHQ-2 or PHQ-9 in the last 360 days  ?  ?  Passed - Valid encounter within last 6 months  ?  Recent Outpatient Visits   ?      ? 3 months ago Uncontrolled type 2 diabetes mellitus with hyperglycemia, with long-term current use of insulin ([place])  ? [facility] Bo Merino, FNP  ? 5 months ago Uncontrolled type 2 diabetes mellitus with hyperglycemia, with long-term current use of insulin ([place])  ? [facility] Serafina Royals F, FNP  ? 6 months ago Uncontrolled type 2 diabetes mellitus with hyperglycemia, with long-term current use of insulin ([place])  ? [place], FNP  ? 1 year ago Mild intermittent asthma without complication  ? [facility] Delsa Grana, PA-C  ? 1 year ago Uncontrolled type 2 diabetes mellitus with hyperglycemia, with long-term current use of insulin ([place])  ? [facility] Lebron Conners D, MD  ?  ?  ? ?  ?  ?  ? ? ?

## 2022-02-07 DIAGNOSIS — Z419 Encounter for procedure for purposes other than remedying health state, unspecified: Secondary | ICD-10-CM | POA: Diagnosis not present

## 2022-03-05 ENCOUNTER — Other Ambulatory Visit: Payer: Self-pay | Admitting: Family Medicine

## 2022-03-05 DIAGNOSIS — E1165 Type 2 diabetes mellitus with hyperglycemia: Secondary | ICD-10-CM

## 2022-03-09 DIAGNOSIS — Z419 Encounter for procedure for purposes other than remedying health state, unspecified: Secondary | ICD-10-CM | POA: Diagnosis not present

## 2022-04-09 DIAGNOSIS — Z419 Encounter for procedure for purposes other than remedying health state, unspecified: Secondary | ICD-10-CM | POA: Diagnosis not present

## 2022-05-10 DIAGNOSIS — Z419 Encounter for procedure for purposes other than remedying health state, unspecified: Secondary | ICD-10-CM | POA: Diagnosis not present

## 2022-06-09 DIAGNOSIS — Z419 Encounter for procedure for purposes other than remedying health state, unspecified: Secondary | ICD-10-CM | POA: Diagnosis not present

## 2022-07-02 ENCOUNTER — Other Ambulatory Visit: Payer: Self-pay | Admitting: Family Medicine

## 2022-07-02 ENCOUNTER — Other Ambulatory Visit: Payer: Self-pay | Admitting: Nurse Practitioner

## 2022-07-02 DIAGNOSIS — J4520 Mild intermittent asthma, uncomplicated: Secondary | ICD-10-CM

## 2022-07-02 DIAGNOSIS — G4700 Insomnia, unspecified: Secondary | ICD-10-CM

## 2022-07-02 DIAGNOSIS — K219 Gastro-esophageal reflux disease without esophagitis: Secondary | ICD-10-CM

## 2022-07-03 NOTE — Telephone Encounter (Signed)
Requested Prescriptions  Pending Prescriptions Disp Refills  . traZODone (DESYREL) 100 MG tablet [Pharmacy Med Name: traZODone HCl 100 MG Oral Tablet] 90 tablet 0    Sig: TAKE 1 TABLET BY MOUTH AT BEDTIME AS NEEDED     Psychiatry: Antidepressants - Serotonin Modulator Failed - 07/02/2022  1:42 PM      Failed - Valid encounter within last 6 months    Recent Outpatient Visits          9 months ago Uncontrolled type 2 diabetes mellitus with hyperglycemia, with long-term current use of insulin [facility])   [facility] Bo Merino, FNP   11 months ago Uncontrolled type 2 diabetes mellitus with hyperglycemia, with long-term current use of insulin [facility])   [facility] Serafina Royals F, FNP   11 months ago Uncontrolled type 2 diabetes mellitus with hyperglycemia, with long-term current use of insulin [facility])   [facility] Serafina Royals F, FNP   1 year ago Mild intermittent asthma without complication   [facility] Delsa Grana, PA-C   2 years ago Uncontrolled type 2 diabetes mellitus with hyperglycemia, with long-term current use of insulin [facility])   [place], MD             Passed - Completed PHQ-2 or PHQ-9 in the last 360 days      . VENTOLIN HFA 108 (90 Base) MCG/ACT inhaler [Pharmacy Med Name: Ventolin HFA 108 (90 Base) MCG/ACT Inhalation Aerosol Solution] 9 g 0    Sig: INHALE 2 PUFFS BY MOUTH EVERY 6 HOURS AS NEEDED FOR WHEEZING FOR SHORTNESS OF BREATH     Pulmonology:  Beta Agonists 2 Passed - 07/02/2022  1:42 PM      Passed - Last BP in normal range    BP Readings from Last 1 Encounters:  09/17/21 132/76         Passed - Last Heart Rate in normal range    Pulse Readings from Last 1 Encounters:  09/17/21 91         Passed - Valid encounter within last 12 months    Recent Outpatient Visits          9 months ago Uncontrolled type 2 diabetes mellitus with  hyperglycemia, with long-term current use of insulin [facility])   [facility] Serafina Royals F, FNP   11 months ago Uncontrolled type 2 diabetes mellitus with hyperglycemia, with long-term current use of insulin [facility])   [facility] Serafina Royals F, FNP   11 months ago Uncontrolled type 2 diabetes mellitus with hyperglycemia, with long-term current use of insulin [facility])   [facility] Serafina Royals F, FNP   1 year ago Mild intermittent asthma without complication   [facility] Delsa Grana, PA-C   2 years ago Uncontrolled type 2 diabetes mellitus with hyperglycemia, with long-term current use of insulin [facility])   [facility] Towanda Malkin, MD

## 2022-07-03 NOTE — Telephone Encounter (Signed)
Requested medication (s) are due for refill today: yes  Requested medication (s) are on the active medication list: yes  Last refill:  01/10/22 #90 with 0 RF  Future visit scheduled: No, last seen 09/17/21, NO SHOW 01/08/22  Notes to clinic:  Failed protocol due to no valid visit within 6  months, please assess.       Requested Prescriptions  Pending Prescriptions Disp Refills   traZODone (DESYREL) 100 MG tablet [Pharmacy Med Name: traZODone HCl 100 MG Oral Tablet] 90 tablet 0    Sig: TAKE 1 TABLET BY MOUTH AT BEDTIME AS NEEDED     Psychiatry: Antidepressants - Serotonin Modulator Failed - 07/02/2022  1:42 PM      Failed - Valid encounter within last 6 months    Recent Outpatient Visits           9 months ago Uncontrolled type 2 diabetes mellitus with hyperglycemia, with long-term current use of insulin [facility])   [facility] Bo Merino, FNP   11 months ago Uncontrolled type 2 diabetes mellitus with hyperglycemia, with long-term current use of insulin [facility])   [facility] Serafina Royals F, FNP   11 months ago Uncontrolled type 2 diabetes mellitus with hyperglycemia, with long-term current use of insulin [facility])   [facility] Serafina Royals F, FNP   1 year ago Mild intermittent asthma without complication   [facility] Delsa Grana, PA-C   2 years ago Uncontrolled type 2 diabetes mellitus with hyperglycemia, with long-term current use of insulin [facility])   [facility] [place], Carola Frost, MD              Passed - Completed PHQ-2 or PHQ-9 in the last 360 days      Signed Prescriptions Disp Refills   VENTOLIN HFA 108 (90 Base) MCG/ACT inhaler 9 g 0    Sig: INHALE 2 PUFFS BY MOUTH EVERY 6 HOURS AS NEEDED FOR WHEEZING FOR SHORTNESS OF BREATH     Pulmonology:  Beta Agonists 2 Passed - 07/02/2022  1:42 PM      Passed - Last BP in normal range    BP Readings from Last 1 Encounters:   09/17/21 132/76         Passed - Last Heart Rate in normal range    Pulse Readings from Last 1 Encounters:  09/17/21 91         Passed - Valid encounter within last 12 months    Recent Outpatient Visits           9 months ago Uncontrolled type 2 diabetes mellitus with hyperglycemia, with long-term current use of insulin [facility])   [facility] Serafina Royals F, FNP   11 months ago Uncontrolled type 2 diabetes mellitus with hyperglycemia, with long-term current use of insulin [facility])   [facility] Serafina Royals F, FNP   11 months ago Uncontrolled type 2 diabetes mellitus with hyperglycemia, with long-term current use of insulin [facility])   [facility] Serafina Royals F, FNP   1 year ago Mild intermittent asthma without complication   [facility] Delsa Grana, PA-C   2 years ago Uncontrolled type 2 diabetes mellitus with hyperglycemia, with long-term current use of insulin [facility])   [facility] Towanda Malkin, MD

## 2022-07-10 ENCOUNTER — Other Ambulatory Visit: Payer: Self-pay | Admitting: Emergency Medicine

## 2022-07-10 DIAGNOSIS — G4700 Insomnia, unspecified: Secondary | ICD-10-CM

## 2022-07-10 DIAGNOSIS — Z419 Encounter for procedure for purposes other than remedying health state, unspecified: Secondary | ICD-10-CM | POA: Diagnosis not present

## 2022-07-10 MED ORDER — TRAZODONE HCL 100 MG PO TABS: 100.0000 mg | ORAL_TABLET | Freq: Every evening | ORAL | 0 refills | Status: AC | PRN

## 2022-08-09 DIAGNOSIS — Z419 Encounter for procedure for purposes other than remedying health state, unspecified: Secondary | ICD-10-CM | POA: Diagnosis not present

## 2022-10-28 ENCOUNTER — Ambulatory Visit: Payer: Self-pay | Admitting: *Deleted

## 2022-10-28 ENCOUNTER — Other Ambulatory Visit: Payer: Self-pay | Admitting: Nurse Practitioner

## 2022-10-28 DIAGNOSIS — K219 Gastro-esophageal reflux disease without esophagitis: Secondary | ICD-10-CM

## 2022-10-28 DIAGNOSIS — E1165 Type 2 diabetes mellitus with hyperglycemia: Secondary | ICD-10-CM

## 2022-10-28 NOTE — Telephone Encounter (Signed)
  Chief Complaint: refill request- over 1 year since seen on  office- patient is living in [place] now Symptoms: fatigue, dizziness, vision changes- glucose- reading recent- over 400 Frequency: 2 weeks  Disposition: [x]$ ED /[]$ Urgent Care (no appt availability in office) / []$ Appointment(In office/virtual)/ []$  [place] [facility]/ []$ Home Care/ []$ Refused Recommended Disposition /[]$  Mobile Bus/ []$  Follow-up with PCP Additional Notes: Patient has been advised to go to ED now- she has to get glucose levels down- she is aware and will call her daughter.

## 2022-10-28 NOTE — Telephone Encounter (Signed)
Medication Refill - Medication:  insulin glargine (LANTUS SOLOSTAR) 100 UNIT/ML Solostar Pen , sitaGLIPtin (JANUVIA) 100 MG tablet (pt is unsure if this is the medication said it's for her diabetes) famotidine (PEPCID) 20 MG tablet    Pt stated she is completely out of her medication.   Has the patient contacted their pharmacy? No. No, more refills.  (Agent: If no, request that the patient contact the pharmacy for the refill. If patient does not wish to contact the pharmacy document the reason why and proceed with request.)  Preferred Pharmacy (with phone number or street name):  [facility] [address], [place]  [phone] [place] [phone]  Phone: [phone] Fax: [phone]  Hours: Not open 24 hours   Has the patient been seen for an appointment in the last year OR does the patient have an upcoming appointment? No. Pt declined to make an appointment and stated she is looking for a new primary care provider closer to her.    Agent: Please be advised that RX refills may take up to 3 business days. We ask that you follow-up with your pharmacy.

## 2022-10-28 NOTE — Telephone Encounter (Signed)
Reason for Disposition . Blood glucose > 400 mg/dL (22.2 mmol/L)    Unknown glucose level- ED advised  Answer Assessment - Initial Assessment Questions 1. LOCATION: "Where does it hurt?"      All over 2. RADIATION: "Does the pain shoot anywhere else?" (e.g., chest, back)     no 3. ONSET: "When did the pain begin?" (e.g., minutes, hours or days ago)      Last week- Thursday 4. SUDDEN: "Gradual or sudden onset?"     Patient reports she is out of medications- 2 weeks- diabetic medication- insulin, jardiance  5. PATTERN "Does the pain come and go, or is it constant?"    - If it comes and goes: "How long does it last?" "Do you have pain now?"     (Note: Comes and goes means the pain is intermittent. It goes away completely between bouts.)    - If constant: "Is it getting better, staying the same, or getting worse?"      (Note: Constant means the pain never goes away completely; most serious pain is constant and gets worse.)      constant Patient reports she has not taken medication in 2 weeks- insulin and oral - protocol changed to hyperglycemia- ED advised  Protocols used: Abdominal Pain - Female-A-AH, Diabetes - High Blood Sugar-A-AH

## 2022-10-29 DIAGNOSIS — F17210 Nicotine dependence, cigarettes, uncomplicated: Secondary | ICD-10-CM | POA: Diagnosis not present

## 2022-10-29 DIAGNOSIS — D7389 Other diseases of spleen: Secondary | ICD-10-CM | POA: Diagnosis not present

## 2022-10-29 DIAGNOSIS — E876 Hypokalemia: Secondary | ICD-10-CM | POA: Diagnosis not present

## 2022-10-29 DIAGNOSIS — Z794 Long term (current) use of insulin: Secondary | ICD-10-CM | POA: Diagnosis not present

## 2022-10-29 DIAGNOSIS — M5442 Lumbago with sciatica, left side: Secondary | ICD-10-CM | POA: Diagnosis not present

## 2022-10-29 DIAGNOSIS — R1033 Periumbilical pain: Secondary | ICD-10-CM | POA: Diagnosis not present

## 2022-10-29 DIAGNOSIS — K769 Liver disease, unspecified: Secondary | ICD-10-CM | POA: Diagnosis not present

## 2022-10-29 DIAGNOSIS — E1165 Type 2 diabetes mellitus with hyperglycemia: Secondary | ICD-10-CM | POA: Diagnosis not present

## 2022-10-29 NOTE — Telephone Encounter (Signed)
Requested medication (s) are due for refill today: expired medications   Requested medication (s) are on the active medication list: yes   Last refill:  januvia- 07/19/21 #90 3 refills, lantus 07/11/21 #71m 3 refills, pepcid 07/02/22 #180 0 refills  Future visit scheduled: no   Notes to clinic:  expired medications. No refills remain for pepcid. Called patient to schedule appt for medication refills. No answer LVMTCB. Do you want to renew Rxs?     Requested Prescriptions  Pending Prescriptions Disp Refills   famotidine (PEPCID) 20 MG tablet 180 tablet 0     Gastroenterology:  H2 Antagonists Failed - 10/29/2022  4:55 PM      Failed - Valid encounter within last 12 months    Recent Outpatient Visits           1 year ago Uncontrolled type 2 diabetes mellitus with hyperglycemia, with long-term current use of insulin ([facility]   [facility]   1 year ago Uncontrolled type 2 diabetes mellitus with hyperglycemia, with long-term current use of insulin ([facility]   [facility]   1 year ago Uncontrolled type 2 diabetes mellitus with hyperglycemia, with long-term current use of insulin ([facility]   [facility]   2 years ago Mild intermittent asthma without complication   [facility]   2 years ago Uncontrolled type 2 diabetes mellitus with hyperglycemia, with long-term current use of insulin ([place]   [facility]               sitaGLIPtin (JANUVIA) 100 MG tablet 90 tablet 3    Sig: TAKE 1 TABLET BY MOUTH ONCE DAILY FOR  DIABETES     Endocrinology:  Diabetes - DPP-4 Inhibitors Failed - 10/29/2022  4:55 PM      Failed - HBA1C is between 0 and 7.9 and within 180 days    HbA1c, POC (prediabetic range)  Date Value Ref Range Status  11/05/2018 13.0 (A) 5.7 - 6.4 %  Final   HbA1c, POC (controlled diabetic range)  Date Value Ref Range Status  11/05/2018 13.0 (A) 0.0 - 7.0 % Final   HbA1c POC (<> result, manual entry)  Date Value Ref Range Status  11/05/2018 13.0 4.0 - 5.6 % Final   Hgb A1c MFr Bld  Date Value Ref Range Status  07/11/2021 12.0 (H) <5.7 % of total Hgb Final    Comment:    For someone without known diabetes, a hemoglobin A1c value of 6.5% or greater indicates that they may have  diabetes and this should be confirmed with a follow-up  test. . For someone with known diabetes, a value <7% indicates  that their diabetes is well controlled and a value  greater than or equal to 7% indicates suboptimal  control. A1c targets should be individualized based on  duration of diabetes, age, comorbid conditions, and  other considerations. . Currently, no consensus exists regarding use of hemoglobin A1c for diagnosis of diabetes for children. .          Failed - Cr in normal range and within 360 days    Creat  Date Value Ref Range Status  07/11/2021 0.84 0.50 - 1.05 mg/dL Final   Creatinine, Urine  Date Value Ref Range Status  07/11/2021 66 20 - 275 mg/dL Final  Failed - Valid encounter within last 6 months    Recent Outpatient Visits           1 year ago Uncontrolled type 2 diabetes mellitus with hyperglycemia, with long-term current use of insulin [facility])   [facility] Serafina Royals F, FNP   1 year ago Uncontrolled type 2 diabetes mellitus with hyperglycemia, with long-term current use of insulin [facility])   [facility] Bo Merino, FNP   1 year ago Uncontrolled type 2 diabetes mellitus with hyperglycemia, with long-term current use of insulin [facility])   [facility] Bo Merino, FNP   2 years ago Mild intermittent asthma without complication   [facility] Delsa Gr[facility], PA-C   2 years ago Uncontrolled type 2  diabetes mellitus with hyperglycemia, with long-term current use of insulin ([place])   [facility] Lebron Conners D, MD               insulin glargine (LANTUS SOLOSTAR) 100 UNIT/ML Solostar Pen 15 mL 3    Sig: INJECT [place] DAILY     Endocrinology:  Diabetes - Insulins Failed - 10/29/2022  4:55 PM      Failed - HBA1C is between 0 and 7.9 and within 180 days    HbA1c, POC (prediabetic range)  Date Value Ref Range Status  11/05/2018 13.0 (A) 5.7 - 6.4 % Final   HbA1c, POC (controlled diabetic range)  Date Value Ref Range Status  11/05/2018 13.0 (A) 0.0 - 7.0 % Final   HbA1c POC (<> result, manual entry)  Date Value Ref Range Status  11/05/2018 13.0 4.0 - 5.6 % Final   Hgb A1c MFr Bld  Date Value Ref Range Status  07/11/2021 12.0 (H) <5.7 % of total Hgb Final    Comment:    For someone without known diabetes, a hemoglobin A1c value of 6.5% or greater indicates that they may have  diabetes and this should be confirmed with a follow-up  test. . For someone with known diabetes, a value <7% indicates  that their diabetes is well controlled and a value  greater than or equal to 7% indicates suboptimal  control. A1c targets should be individualized based on  duration of diabetes, age, comorbid conditions, and  other considerations. . Currently, no consensus exists regarding use of hemoglobin A1c for diagnosis of diabetes for children. .          Failed - Valid encounter within last 6 months    Recent Outpatient Visits           1 year ago Uncontrolled type 2 diabetes mellitus with hyperglycemia, with long-term current use of insulin [facility])   [facility] Serafina Royals F, FNP   1 year ago Uncontrolled type 2 diabetes mellitus with hyperglycemia, with long-term current use of insulin [facility])   [facility] Serafina Royals F, FNP   1 year ago Uncontrolled type 2 diabetes  mellitus with hyperglycemia, with long-term current use of insulin [facility])   [facility] Serafina Royals F, FNP   2 years ago Mild intermittent asthma without complication   [facility] Delsa Grana, PA-C   2 years ago Uncontrolled type 2 diabetes mellitus with hyperglycemia, with long-term current use of insulin [facility])   [facility] Towanda Malkin, MD

## 2022-10-29 NOTE — Telephone Encounter (Signed)
Called patient to schedule appt. For medication refills. Medication expired and need appt scheduled. No answer .LVMTCB

## 2022-10-29 NOTE — Telephone Encounter (Signed)
Called patient left message to see if she went to ER. Informed her she need appointment here at the office since its been over a year. If she is now out of town she may need to find a doctor in her area.

## 2022-10-31 DIAGNOSIS — E1159 Type 2 diabetes mellitus with other circulatory complications: Secondary | ICD-10-CM | POA: Diagnosis not present

## 2022-10-31 DIAGNOSIS — R161 Splenomegaly, not elsewhere classified: Secondary | ICD-10-CM | POA: Diagnosis not present

## 2022-10-31 DIAGNOSIS — E785 Hyperlipidemia, unspecified: Secondary | ICD-10-CM | POA: Diagnosis not present

## 2022-10-31 DIAGNOSIS — K769 Liver disease, unspecified: Secondary | ICD-10-CM | POA: Diagnosis not present

## 2022-10-31 DIAGNOSIS — E119 Type 2 diabetes mellitus without complications: Secondary | ICD-10-CM | POA: Diagnosis not present

## 2022-10-31 DIAGNOSIS — K219 Gastro-esophageal reflux disease without esophagitis: Secondary | ICD-10-CM | POA: Diagnosis not present

## 2022-10-31 DIAGNOSIS — G4700 Insomnia, unspecified: Secondary | ICD-10-CM | POA: Diagnosis not present

## 2022-10-31 DIAGNOSIS — Z1331 Encounter for screening for depression: Secondary | ICD-10-CM | POA: Diagnosis not present

## 2022-10-31 DIAGNOSIS — Z131 Encounter for screening for diabetes mellitus: Secondary | ICD-10-CM | POA: Diagnosis not present

## 2022-10-31 DIAGNOSIS — E1169 Type 2 diabetes mellitus with other specified complication: Secondary | ICD-10-CM | POA: Diagnosis not present

## 2022-10-31 DIAGNOSIS — Z09 Encounter for follow-up examination after completed treatment for conditions other than malignant neoplasm: Secondary | ICD-10-CM | POA: Diagnosis not present

## 2022-11-01 NOTE — Telephone Encounter (Signed)
Called patient to make appointment to be seen since its been 1 year

## 2022-11-08 DIAGNOSIS — Z419 Encounter for procedure for purposes other than remedying health state, unspecified: Secondary | ICD-10-CM | POA: Diagnosis not present
